# Patient Record
Sex: Female | Born: 1940 | Race: White | Hispanic: No | Smoking: Never smoker
Health system: Southern US, Community
[De-identification: ages and names within clinical notes are randomized; demographics above are authoritative.]

## PROBLEM LIST (undated history)

## (undated) DIAGNOSIS — E079 Disorder of thyroid, unspecified: Secondary | ICD-10-CM

## (undated) DIAGNOSIS — F319 Bipolar disorder, unspecified: Secondary | ICD-10-CM

## (undated) DIAGNOSIS — F419 Anxiety disorder, unspecified: Secondary | ICD-10-CM

## (undated) HISTORY — DX: Anxiety disorder, unspecified: F41.9

---

## 2004-07-28 ENCOUNTER — Other Ambulatory Visit: Payer: Self-pay

## 2004-08-03 ENCOUNTER — Inpatient Hospital Stay: Payer: Self-pay | Admitting: Obstetrics & Gynecology

## 2008-04-30 ENCOUNTER — Emergency Department: Payer: Self-pay | Admitting: Emergency Medicine

## 2008-04-30 ENCOUNTER — Other Ambulatory Visit: Payer: Self-pay

## 2008-06-06 ENCOUNTER — Emergency Department: Payer: Self-pay | Admitting: Emergency Medicine

## 2008-06-06 ENCOUNTER — Other Ambulatory Visit: Payer: Self-pay

## 2008-06-10 ENCOUNTER — Inpatient Hospital Stay: Payer: Self-pay | Admitting: Psychiatry

## 2009-06-09 ENCOUNTER — Emergency Department: Payer: Self-pay | Admitting: Emergency Medicine

## 2009-08-07 ENCOUNTER — Emergency Department: Payer: Self-pay | Admitting: Emergency Medicine

## 2009-08-10 ENCOUNTER — Emergency Department: Payer: Self-pay | Admitting: Emergency Medicine

## 2009-12-01 ENCOUNTER — Emergency Department: Payer: Self-pay | Admitting: Emergency Medicine

## 2009-12-13 ENCOUNTER — Inpatient Hospital Stay: Payer: Self-pay | Admitting: Psychiatry

## 2013-08-17 ENCOUNTER — Ambulatory Visit (INDEPENDENT_AMBULATORY_CARE_PROVIDER_SITE_OTHER): Payer: Medicare HMO | Admitting: Podiatry

## 2013-08-17 ENCOUNTER — Encounter: Payer: Self-pay | Admitting: Podiatry

## 2013-08-17 VITALS — BP 141/92 | HR 66 | Resp 14 | Ht 63.0 in | Wt 165.0 lb

## 2013-08-17 DIAGNOSIS — M79609 Pain in unspecified limb: Secondary | ICD-10-CM

## 2013-08-17 DIAGNOSIS — B351 Tinea unguium: Secondary | ICD-10-CM

## 2013-08-17 NOTE — Progress Notes (Signed)
[name] presents today chief complaint of painful toenails one through 5 bilateral.  Objective: Pulses are palpable bilateral nails are thick yellow dystrophic clinically mycotic and painful.  Assessment: Pain in limb secondary to onychomycosis.  Plan: Debridement of nails 1 through 5 bilateral is cover service secondary to pain. Followup with her in 3 months.

## 2013-11-11 ENCOUNTER — Ambulatory Visit: Payer: Medicare HMO | Admitting: Podiatry

## 2013-12-14 ENCOUNTER — Ambulatory Visit (INDEPENDENT_AMBULATORY_CARE_PROVIDER_SITE_OTHER): Payer: Medicare HMO | Admitting: Podiatry

## 2013-12-14 VITALS — BP 113/75 | HR 86 | Resp 16 | Ht 63.0 in | Wt 170.0 lb

## 2013-12-14 DIAGNOSIS — B351 Tinea unguium: Secondary | ICD-10-CM

## 2013-12-14 DIAGNOSIS — M79609 Pain in unspecified limb: Secondary | ICD-10-CM

## 2013-12-14 NOTE — Progress Notes (Signed)
She presents today with a chief complaint of painful toenails one through 5 bilateral.  Objective: Vital signs are stable she is alert and oriented x3.  Assessment: Pain in limb secondary to onychomycosis 1 through 5 bilateral.  Plan: Debridement of nails 1 through 5 bilateral is cover service.

## 2014-02-08 ENCOUNTER — Ambulatory Visit: Payer: Self-pay | Admitting: Internal Medicine

## 2014-03-15 ENCOUNTER — Ambulatory Visit (INDEPENDENT_AMBULATORY_CARE_PROVIDER_SITE_OTHER): Payer: Medicare HMO | Admitting: Podiatry

## 2014-03-15 VITALS — BP 102/68 | HR 92 | Resp 16

## 2014-03-15 DIAGNOSIS — M79609 Pain in unspecified limb: Secondary | ICD-10-CM

## 2014-03-15 DIAGNOSIS — B351 Tinea unguium: Secondary | ICD-10-CM

## 2014-03-15 NOTE — Progress Notes (Signed)
She presents today with a chief complaint of painful elongated toenails one through 5 bilateral.  Objective: Pulses are palpable bilateral. Nails are thick yellow dystrophic and mycotic and painful palpation.  Assessment: Pain in limb secondary to onychomycosis 1 through 5 bilateral.  Plan: Debridement of nails 1 through 5 bilateral covered service secondary to pain.

## 2014-04-02 DIAGNOSIS — D649 Anemia, unspecified: Secondary | ICD-10-CM | POA: Insufficient documentation

## 2014-05-16 ENCOUNTER — Emergency Department: Payer: Self-pay | Admitting: Emergency Medicine

## 2014-05-16 LAB — URINALYSIS, COMPLETE
BILIRUBIN, UR: NEGATIVE
Blood: NEGATIVE
Glucose,UR: NEGATIVE mg/dL (ref 0–75)
KETONE: NEGATIVE
Nitrite: NEGATIVE
Ph: 5 (ref 4.5–8.0)
RBC,UR: 13 /HPF (ref 0–5)
Specific Gravity: 1.025 (ref 1.003–1.030)
Squamous Epithelial: 35
WBC UR: 45 /HPF (ref 0–5)

## 2014-05-16 LAB — CBC WITH DIFFERENTIAL/PLATELET
BASOS PCT: 0.3 %
Basophil #: 0 10*3/uL (ref 0.0–0.1)
EOS ABS: 0 10*3/uL (ref 0.0–0.7)
Eosinophil %: 0.3 %
HCT: 37.2 % (ref 35.0–47.0)
HGB: 12.2 g/dL (ref 12.0–16.0)
LYMPHS ABS: 1.7 10*3/uL (ref 1.0–3.6)
Lymphocyte %: 24.7 %
MCH: 28.9 pg (ref 26.0–34.0)
MCHC: 32.7 g/dL (ref 32.0–36.0)
MCV: 88 fL (ref 80–100)
Monocyte #: 0.5 x10 3/mm (ref 0.2–0.9)
Monocyte %: 7.7 %
NEUTROS ABS: 4.6 10*3/uL (ref 1.4–6.5)
Neutrophil %: 67 %
Platelet: 277 10*3/uL (ref 150–440)
RBC: 4.21 10*6/uL (ref 3.80–5.20)
RDW: 13.8 % (ref 11.5–14.5)
WBC: 6.9 10*3/uL (ref 3.6–11.0)

## 2014-05-16 LAB — BASIC METABOLIC PANEL
ANION GAP: 8 (ref 7–16)
BUN: 10 mg/dL (ref 7–18)
Calcium, Total: 8.9 mg/dL (ref 8.5–10.1)
Chloride: 101 mmol/L (ref 98–107)
Co2: 25 mmol/L (ref 21–32)
Creatinine: 0.8 mg/dL (ref 0.60–1.30)
EGFR (African American): >60
GLUCOSE: 107 mg/dL — AB (ref 65–99)
Osmolality: 268 (ref 275–301)
POTASSIUM: 3.9 mmol/L (ref 3.5–5.1)
SODIUM: 134 mmol/L — AB (ref 136–145)

## 2014-05-18 LAB — URINE CULTURE

## 2014-06-14 ENCOUNTER — Ambulatory Visit: Payer: Medicare HMO | Admitting: Podiatry

## 2014-07-19 ENCOUNTER — Ambulatory Visit: Payer: Medicare HMO | Admitting: Podiatry

## 2014-08-18 ENCOUNTER — Ambulatory Visit: Payer: Medicare HMO | Admitting: Podiatry

## 2014-09-27 ENCOUNTER — Other Ambulatory Visit: Payer: Medicare HMO

## 2014-09-27 ENCOUNTER — Ambulatory Visit: Payer: Medicare HMO | Admitting: Podiatry

## 2014-12-30 ENCOUNTER — Inpatient Hospital Stay: Payer: Self-pay | Admitting: Internal Medicine

## 2015-01-03 ENCOUNTER — Encounter
Admit: 2015-01-03 | Disposition: A | Discharge status: Home / Self Care | Payer: Self-pay | Attending: Internal Medicine | Admitting: Internal Medicine

## 2015-01-20 LAB — COMPREHENSIVE METABOLIC PANEL
ANION GAP: 10 (ref 7–16)
Albumin: 3.6 g/dL
Alkaline Phosphatase: 140 U/L — ABNORMAL HIGH
BUN: 17 mg/dL
Bilirubin,Total: 0.2 mg/dL — ABNORMAL LOW
CO2: 28 mmol/L
Calcium, Total: 9.5 mg/dL
Chloride: 93 mmol/L — ABNORMAL LOW
Creatinine: 0.72 mg/dL
EGFR (African American): >60
Glucose: 89 mg/dL
Potassium: 4 mmol/L
SGOT(AST): 22 U/L
SGPT (ALT): 16 U/L
SODIUM: 131 mmol/L — AB
Total Protein: 7.1 g/dL

## 2015-01-20 LAB — CBC WITH DIFFERENTIAL/PLATELET
BASOS PCT: 0.5 %
Basophil #: 0 10*3/uL (ref 0.0–0.1)
EOS PCT: 0 %
Eosinophil #: 0 10*3/uL (ref 0.0–0.7)
HCT: 34.6 % — ABNORMAL LOW (ref 35.0–47.0)
HGB: 11.3 g/dL — ABNORMAL LOW (ref 12.0–16.0)
Lymphocyte #: 1.5 10*3/uL (ref 1.0–3.6)
Lymphocyte %: 20.3 %
MCH: 29.3 pg (ref 26.0–34.0)
MCHC: 32.7 g/dL (ref 32.0–36.0)
MCV: 90 fL (ref 80–100)
MONO ABS: 0.4 x10 3/mm (ref 0.2–0.9)
Monocyte %: 5.5 %
NEUTROS PCT: 73.7 %
Neutrophil #: 5.5 10*3/uL (ref 1.4–6.5)
Platelet: 348 10*3/uL (ref 150–440)
RBC: 3.85 10*6/uL (ref 3.80–5.20)
RDW: 13.8 % (ref 11.5–14.5)
WBC: 7.4 10*3/uL (ref 3.6–11.0)

## 2015-01-20 LAB — MAGNESIUM: MAGNESIUM: 2.1 mg/dL

## 2015-01-20 LAB — TSH: Thyroid Stimulating Horm: 15.59 u[IU]/mL — ABNORMAL HIGH

## 2015-01-26 ENCOUNTER — Emergency Department
Admit: 2015-01-26 | Disposition: A | Discharge status: Other Acute Inpatient Hospital | Payer: Self-pay | Admitting: Internal Medicine

## 2015-01-26 DIAGNOSIS — M3390 Dermatopolymyositis, unspecified, organ involvement unspecified: Secondary | ICD-10-CM | POA: Insufficient documentation

## 2015-01-26 DIAGNOSIS — K219 Gastro-esophageal reflux disease without esophagitis: Secondary | ICD-10-CM | POA: Insufficient documentation

## 2015-01-26 DIAGNOSIS — F319 Bipolar disorder, unspecified: Secondary | ICD-10-CM | POA: Insufficient documentation

## 2015-01-26 LAB — CBC
HCT: 34.5 % — ABNORMAL LOW (ref 35.0–47.0)
HGB: 11.2 g/dL — ABNORMAL LOW (ref 12.0–16.0)
MCH: 28.9 pg (ref 26.0–34.0)
MCHC: 32.5 g/dL (ref 32.0–36.0)
MCV: 89 fL (ref 80–100)
Platelet: 279 10*3/uL (ref 150–440)
RBC: 3.88 10*6/uL (ref 3.80–5.20)
RDW: 13.5 % (ref 11.5–14.5)
WBC: 13.3 10*3/uL — ABNORMAL HIGH (ref 3.6–11.0)

## 2015-01-26 LAB — COMPREHENSIVE METABOLIC PANEL
ALT: 15 U/L
AST: 23 U/L
Albumin: 3.7 g/dL
Alkaline Phosphatase: 141 U/L — ABNORMAL HIGH
Anion Gap: 8 (ref 7–16)
BUN: 12 mg/dL
Bilirubin,Total: 0.3 mg/dL
CO2: 27 mmol/L
CREATININE: 0.76 mg/dL
Calcium, Total: 9.2 mg/dL
Chloride: 93 mmol/L — ABNORMAL LOW
EGFR (African American): >60
GLUCOSE: 128 mg/dL — AB
POTASSIUM: 4.2 mmol/L
SODIUM: 128 mmol/L — AB
TOTAL PROTEIN: 7.1 g/dL

## 2015-01-28 ENCOUNTER — Encounter
Admit: 2015-01-28 | Disposition: A | Discharge status: Home / Self Care | Payer: Self-pay | Attending: Internal Medicine | Admitting: Internal Medicine

## 2015-02-21 LAB — SURGICAL PATHOLOGY

## 2015-02-27 NOTE — Discharge Summary (Signed)
PATIENT NAME:  [name], HINE MR#:  [id number] DATE OF BIRTH:  Jul 13, 1941  DATE OF ADMISSION:  12/30/2014 DATE OF DISCHARGE:  01/03/2015   TYPE OF DISCHARGE: The patient is transferred to a skilled nursing facility.   REASON FOR ADMISSION: Right hip fracture.   HISTORY OF PRESENT ILLNESS: The patient is a 74 year old female with a history of dermatomyositis, hypothyroidism, bipolar disorder and hypertension who presented to the Emergency Room with right lower extremity pain after a fall 2 weeks prior. She was found to have a hip fracture and was admitted for further evaluation.   PAST MEDICAL HISTORY:  1.  Dermatomyositis.  2.  Bipolar disorder.  3.  Benign hypertension.  4.  Hypothyroidism.   MEDICATIONS ON ADMISSION: Please see admission note.   ALLERGIES: No known drug allergies.   SOCIAL HISTORY: Negative for alcohol or tobacco abuse.   FAMILY HISTORY: Positive for coronary artery disease.   REVIEW OF SYSTEMS:  As per admission note.   PHYSICAL EXAMINATION:  GENERAL: The patient was in no acute distress.  VITAL SIGNS: Stable and she was afebrile.  HEENT: Unremarkable.  NECK: Supple without JVD.  LUNGS: Clear.  CARDIAC: Regular rate and rhythm. Normal S1, S2.  ABDOMEN: Soft and nontender.  EXTREMITIES: Without edema.  NEUROLOGIC: Grossly nonfocal.   HOSPITAL COURSE: The patient was admitted with a right hip fracture. She was seen in consultation by orthopedics and underwent surgical repair. She was subsequently seen by physical therapy and ambulated. Short-term rehab was recommended and the family agreed. There were no medical complications during this hospitalization. She was maintained on her routine regimen. By 01/03/2015, the patient was stable and ready for discharge to a skilled nursing facility.   DISCHARGE DIAGNOSES:  1.  Right hip fracture, status post surgical repair.  2.  Benign hypertension.  3.  Hypothyroidism.  4.  Postoperative anemia.  5.  Bipolar  disorder.  6.  Dermatomyositis.   DISCHARGE MEDICATIONS:  1.  Os-Cal-D 1 p.o. b.i.d.  2.  Surfak 240 mg p.o. at bedtime.  3.  Iron sulfate 325 mg p.o. b.i.d.  4.  Oxycodone 10 mg p.o. every 4 hours p.r.n. pain.  5.  BuSpar 15 mg p.o. b.i.d.  6.  Synthroid 75 mcg p.o. daily.  7.  Losartan 25 mg p.o. daily.  8.  Effexor-XR 150 mg p.o. daily.  9.  Zantac 150 mg p.o. b.i.d.  10.  Abilify 2 mg p.o. daily.   FOLLOW-UP PLANS AND APPOINTMENTS: The patient will be followed by the resident physician at the skilled nursing facility. She is on a regular diet. She will be seen in consultation by physical therapy. TED hose to be worn daily, taken off at bedtime. CBC and a MET-B in 1 week.     ____________________________ Leonie Douglas. Doy Hutching, MD jds:DT D: 01/03/2015 07:14:52 ET T: 01/03/2015 08:11:08 ET JOB#: [id number]  cc: Leonie Douglas. Doy Hutching, MD, <Dictator> Morgann Woodburn Lennice Sites MD ELECTRONICALLY SIGNED 01/04/2015 20:50

## 2015-02-27 NOTE — Consult Note (Signed)
PATIENT NAME:  [name], [name] MR#:  [id number] DATE OF BIRTH:  1941-01-09  DATE OF CONSULTATION:  12/30/2014  REFERRING PHYSICIAN:   CONSULTING PHYSICIAN:  Timoteo Gaul, MD  REASON FOR CONSULTATION: Right femoral neck hip fracture.   HISTORY OF PRESENT ILLNESS: [name] is a 74 year old female who has had approximately 2 weeks of right lower extremity pain without known trauma. She had been prescribed physical therapy recently by her primary care physician. She had worsening pain over the past 2 weeks until yesterday when she had difficulty bearing weight on the right lower extremity. She was brought to the [facility] Emergency Department where she was diagnosed with a displaced right femoral neck hip fracture.   PAST MEDICAL HISTORY: Includes hypertension, hypothyroidism, bipolar disorder, and history of dermatomyositis.   PAST SURGICAL HISTORY: Includes tonsillectomy, hemorrhoidectomy.   ALLERGIES: No known drug allergies.   HOME MEDICATIONS: Include Abilify 2 mg once daily at bedtime, biotin 1000 mcg tablet 0.5 mg orally once daily, buspirone 15 mg 1 tablet b.i.d., Colace 100 mg b.i.d., levothyroxine 75 mcg 1 tablet q.a.m., lisinopril 5 mg daily, losartan 100 mg daily, meloxicam 7.5 mg 1 tablet daily, multivitamin daily, polyethylene glycol oral powder twice a day, tramadol 15 mg 1 tablet q. 6 hours p.r.n. for pain, venlafaxine 150 mg 1 tablet daily.   PHYSICAL EXAMINATION: The patient is seen in her hospital room. She is lying supine in bed. There is Buck's traction on the right lower extremity. She has slight shortening and external rotation of her right lower extremity. Her skin is intact. Her thigh and leg compartments are soft and compressible. I marked the right hip with the word "yes" and my initials according to the hospital's correct site of surgery protocol. The patient had palpable pedal pulses. She has intact sensation throughout the right lower extremity. She can flex  and extend her toes.   RADIOLOGY: X-ray films of the pelvis and right hip were reviewed today. These films demonstrate a displaced femoral neck hip fracture. There were no associated pelvic fractures and there is no fracture or dislocation to the left hip.   ASSESSMENT: Right femoral neck hip fracture.   PLAN: I am recommending a right hemiarthroplasty for this patient. She is ambulatory at baseline. I reviewed the risks and benefits of surgery including infection requiring removal of the prosthesis, bleeding requiring blood transfusion, nerve or blood vessel injury including injury to the sciatic nerve leading to footdrop and may require long-term use of an AFO, persistent right hip pain, fracture, dislocation and the need for development of arthritis and the need for further surgery including conversion to a total hip arthroplasty. Medical risks include DVT and pulmonary embolism, myocardial infarction, stroke, pneumonia, respiratory failure, and death. I spoke with Dr. Fulton Reek this morning. He had seen the patient and feels that she is medically stable for surgery. I have reviewed her labs and radiographic studies in preparation for this case. The surgical site was marked send sign, consent is signed. She is n.p.o. Surgery is scheduled this morning. I answered all the patient's questions. I attempted to contact her son by phone, but was unable to make contact as of the time of this dictation.    ____________________________ Timoteo Gaul, MD klk:at D: 12/30/2014 08:54:30 ET T: 12/30/2014 09:34:14 ET JOB#: [id number]  cc: Timoteo Gaul, MD, <Dictator> Timoteo Gaul MD ELECTRONICALLY SIGNED 12/31/2014 18:31

## 2015-02-27 NOTE — H&P (Signed)
PATIENT NAME:  [name], [name] MR#:  [id number] DATE OF BIRTH:  1941/07/21  DATE OF ADMISSION:  12/30/2014  REFERRING DOCTOR: Ahmed Prima, MD   PRIMARY CARE PRACTITIONER: Leonie Douglas. Doy Hutching, MD, of [facility].   ADMITTING DOCTOR: Juluis Mire, MD    CHIEF COMPLAINT: Ongoing right lower extremity pain for the past 2 weeks.    HISTORY OF PRESENT ILLNESS: A 74 year old Caucasian female with a past medical history of hypertension, hypothyroidism, bipolar disorder, dermatomyositis, a resident of nursing home was brought by EMS with the complaints of worsening pain of right lower extremity, which started around 2 weeks ago. The patient stated that she developed right lower extremity pain about 2 weeks ago for which she was advised to undergo physical therapy by her primary care practitioner, and she has been undergoing physical therapy. The pain gradually worsened today and it was unbearable today, hence came to the Emergency Room for further evaluation. In the Emergency Room, the patient was evaluated by the ED physician and workup revealed fracture of the right femur of the neck, which is mildly displaced. The patient was also noted to have elevated blood pressure on arrival to the Emergency Room secondary due to pain for which the patient received IV pain control medications, following which her blood pressure is currently under reasonable control. Her pain is also under reasonable control with pain medication at this time and denies any complaints. The patient denies any history of fall. No history of any local injury to the right lower extremity. Denies any history of fever. No chest pain. No shortness of breath. No dizziness. No cough. No fever. No dysuria, frequency, urgency. No nausea, vomiting, diarrhea, constipation.   PAST MEDICAL HISTORY:  1.  Hypertension.  2.  Hypothyroidism.  3.  Bipolar disorder.  4.  History of dermatomyositis.   PAST SURGICAL HISTORY:  1.   Tonsillectomy.  2.  Hemorrhoidectomy.   ALLERGIES: No known drug allergies.   FAMILY HISTORY: Father died of heart disease.   SOCIAL HISTORY: She is a resident of nursing home and is single. Denies any history of smoking, alcohol, or substance abuse.    HOME MEDICATIONS:  1.  Abilify 2 mg tablet 1 tablet orally once a day at bedtime.  2.  Biotin 1000 mcg tablet 0.5 mg orally once a day.  3.  Buspirone 15 mg oral tablet 1 tablet 2 times a day.  4.  Colace 100 mg 1 tablet orally 2 times a day.  5.  Levothyroxine 75 mcg oral tablet 1 tablet in the morning.  6.  Lisinopril 5 mg 1 tablet orally once a day.  7.  Losartan 100 mg 1 tablet orally once a day.  8.  Meloxicam 7.5 mg oral tablet 1 tablet orally once a day.  9.  Multivitamin 1 capsule orally once a day.  10.  Polyethylene glycol oral powder twice a day.  11.  Tramadol 50 mg 1 tablet orally every 6 hours.  12.  Venlafaxine 150 mg oral capsule extended release 1 capsule once a day.    REVIEW OF SYSTEMS:  CONSTITUTIONAL: Negative for fever or chills. No fatigue. No generalized weakness.  EYES: Negative for blurred vision, double vision. No pain. No redness. No discharge.  EARS, NOSE, AND THROAT: Negative for tinnitus, ear pain, hearing loss, epistaxis, nasal discharge.  RESPIRATORY: Negative for cough, wheezing, dyspnea, hemoptysis, or painful respiration.  CARDIOVASCULAR: Negative for palpitations, chest pain, dizziness, syncopal episode, orthopnea, dyspnea on exertion, or pedal  edema.  GASTROINTESTINAL: Negative for nausea, vomiting, diarrhea, constipation, abdominal pain, hematemesis, melena, or GERD symptoms.  GENITOURINARY: Negative for dysuria, frequency, urgency, or hematuria.  ENDOCRINE Negative for polyuria or nocturia. No heat or cold intolerance.  HEMATOLOGIC AND LYMPHATIC : Negative for anemia, easy bruising or bleeding, or swollen glands.  INTEGUMENTARY: Negative for acne, skin rash, or lesions.  MUSCULOSKELETAL: Right  lower extremity pain ongoing for the past 2 weeks, as noted in the history of present illness.   NEUROLOGICAL: Negative for focal weakness or numbness. No history of CVA, TIA, or seizure disorder.  PSYCHIATRIC: Positive for history of bipolar disorder, stable on home medications.   PHYSICAL EXAMINATION:  VITAL SIGNS: Temperature 98.4 degrees Fahrenheit; pulse rate 72 per minute; respirations 18 per minute; oxygen saturation 93% on room air; blood pressure on arrival 214/82, current blood pressure is 131/63.   GENERAL: Well-developed, well-nourished, alert, in no acute distress at this time, comfortably resting in the bed.  HEAD: Atraumatic, normocephalic.  EYES: Pupils equal, react to light and accommodation. No conjunctival pallor. No icterus. Extraocular movements intact.  NOSE: No drainage. No lesions.  EARS: No drainage. No external lesions.  ORAL CAVITY: No mucosal lesions. No exudates.  NECK: Supple. No JVD. No thyromegaly. No carotid bruit. Range of motion of neck within normal limits.  RESPIRATORY: Good respiratory effort. Not using accessory muscles of respiration. Bilateral vesicular breath sounds present. No rales or rhonchi.  CARDIOVASCULAR: S1, S2 regular. No murmurs, gallops, or clicks. Pulses equal at carotid, femoral, and pedal pulses. No peripheral edema.  GASTROINTESTINAL: Abdomen is soft, nontender. No hepatosplenomegaly. No masses. No rigidity. No guarding. Bowel sounds present and equal in all 4 quadrants.  GENITOURINARY: Deferred.  MUSCULOSKELETAL: Shortening of right lower extremity with external rotation present. Range of motion extremely painful. Strength and tone normal.  SKIN: Inspection within normal limits.  LYMPHATIC: No cervical lymphadenopathy.  VASCULAR: Good dorsalis pedis and posterior tibial pulses.  NEUROLOGIC: Alert, awake, and oriented x 3. Cranial nerves II-XII grossly intact. No sensory deficit. Motor strength 5/5 in both upper and lower extremities.  Plantars downgoing.  PSYCHIATRIC: Alert, awake, and oriented x 3. Judgment and insight adequate. Memory and mood within normal limits.   ANCILLARY DATA:  LABORATORY DATA: Serum glucose 117, BUN 18, creatinine 0.86, sodium 134, potassium 4.3, chloride 101, bicarbonate 27, total calcium 9.1. Total protein 7.3, albumin 3.3, total bilirubin 0.3, alkaline phosphatase 132, AST 26, ALT 13. Total CK 116. Troponin less than 0.02. TSH 3.44. WBC 13.7, hemoglobin 12.0, hematocrit 37.1, platelet count 276,000. Prothrombin time 13.2, INR 1.0, activated PTT 33.2. Urinalysis: 1 WBC, no bacteria.    IMAGING STUDIES:  1.  Right hip x-ray: Basicervical fracture through the right femur neck with mild displacement present.  2.  Chest x-ray: Moderate to large hiatus hernia. Mild cardiomegaly. No acute cardiopulmonary process identified.    EKG: Normal sinus rhythm with ventricular rate of 70 beats per minute. Incomplete right bundle branch block. No acute ST-T changes.    ASSESSMENT AND PLAN: A 74 year old Caucasian female, a nursing home resident, with a past medical history of hypertension, hypothyroidism, bipolar disorder, presents with a 2-week history of right lower extremity pain, noted to have fracture of right femoral neck with mild displacement. No history of any reported fall or local injury. 1.  Fracture, right femoral neck with mild displacement. No history of fall or local injury. PLAN: Admit to medical floor, rest, pain control medications. Orthopedic consultation and physical therapy consultation requested for further  management.  2.  Hypertension. The patient on home medications. Initial blood pressure high on arrival secondary to pain. Blood pressure is better controlled with pain control. Continue home medications.  3.  Hypothyroidism, stable on levothyroxine. Continue current dosage.  4.  Bipolar disorder, stable on home medications. Continue same.  5.  Deep vein thrombosis prophylaxis. Subcutaneous  heparin.  6.  Gastrointestinal prophylaxis. Proton pump inhibitor.   CODE STATUS: Full code.   TIME SPENT: 50 minutes.    ____________________________ Juluis Mire, MD enr:bm D: 12/30/2014 02:52:21 ET T: 12/30/2014 05:12:29 ET JOB#: [id number]  cc: Juluis Mire, MD, <Dictator> Leonie Douglas. Doy Hutching, MD Juluis Mire MD ELECTRONICALLY SIGNED 12/30/2014 20:40

## 2015-02-27 NOTE — Op Note (Signed)
PATIENT NAME:  [name], [name] MR#:  [id number] DATE OF BIRTH:  June 01, 1941  DATE OF PROCEDURE:  12/30/2014  PREOPERATIVE DIAGNOSIS: Right displaced femoral neck hip fracture.   POSTOPERATIVE DIAGNOSIS: Right displaced femoral neck hip fracture.   PROCEDURE: Right hip hemiarthroplasty.   ANESTHESIA: General.   SPECIMEN: Femoral head to pathology.   SURGEON: [place], M.D.   ESTIMATED BLOOD LOSS:  100 mL.   COMPLICATIONS: None.   IMPLANTS: Stryker Accolade HFX size 2 stem, a 43 mm UHR universal head bipolar component with a 26 mm +0 L-FIT V40 femoral head.   INDICATION FOR THE PROCEDURE: The patient is a 74 year old female who has had right lower extremity pain for approximately two weeks. The patient denies a fall. She was sent to the [facility] Emergency Department where x-rays revealed a displaced femoral neck hip fracture. Orthopedics was consulted for management of this hip fracture. I explained to the patient and her son about the injury. I am recommending a right hip hemiarthroplasty. She ambulates at baseline.  I reviewed the risks and benefits of surgery with the patient and her son, which include infection requiring removal of the prosthesis, bleeding requiring blood transfusion, nerve or blood vessel injury, especially injury to the sciatic nerve which may lead to lower extremity numbness or footdrop and she may require long-term use of an AFO, fracture, dislocation, leg length discrepancy, change in lower extremity rotation, persistent right hip pain, and the need for further surgery including conversion to a total hip arthroplasty. Medical risks include, but are not limited to DVT and pulmonary embolism, myocardial infarction, stroke, pneumonia, respiratory failure, and death. The patient was cleared by the medical service. I had reviewed the labs and radiographic studies in preparation for this case. The patient and her son understood the risks and benefits of surgery  and wished to proceed with surgery.   PROCEDURE NOTE: The patient had her right hip marked according to the hospital's correct site of surgery protocol. I put my initials and the word "yes" over the right hip. The patient was brought to the Operating Room. She underwent general anesthesia. She was then positioned in a left lateral decubitus position with the right hip up. She was positioned using a pegboard. An axillary roll was placed under her left side, and adequate padding was placed under her left leg to protect the common peroneal nerve during the case. She was then prepped and draped in a sterile fashion. A timeout was performed to verify the patient's name, date of birth, medical record number, correct site of surgery and correct procedure to be performed. It was also used to verify the patient had received antibiotics and all appropriate instruments, implants, and radiographic studies were available in the room. Once all in attendance were in agreement, the case began.   A curvilinear incision was made over the greater trochanter. Subcutaneous tissues were dissected using electrocautery. All bleeding vessels were cauterized during exposure. The fascia lata was then identified and sharply incised with a deep #10 blade. The gluteus maximus muscle was split in line with its fibers revealing the underlying hip bursa and external rotators. The hip bursa was excised and the external rotators were removed from their attachment to the posterior greater trochanter, tagged for later repair and reflected posteriorly to protect the sciatic nerve. A T-shaped capsulotomy was then performed. The capsule was tagged for later repair. The fracture was then identified. The femoral head was removed using a corkscrew device.  The femoral head  was measured at 43 mm in diameter. A 43 mm trial was placed into the acetabulum and had excellent fit. The attention was then turned to femur preparation after the pulvinar in the  acetabulum had been removed and all bleeding was cauterized. The acetabulum was copiously irrigated and suctioned dry.   The hip skid was placed under the femoral neck. The femur was internally rotated. A Kober retractor was also used to allow for visualization of the calcar. The fracture exited just above the lesser trochanter. A single hand reamer was used to enter into the femoral canal. A femoral canal sounder was then placed into the femoral canal to ensure no penetration of the femoral cortex that occurred during hand reaming. Sequential broaches were then used starting with a size zero. The best medial and lateral fit was achieved with a size 2 femoral trial.  A 127 degree neck trial was then placed on the femoral stem and a 43 + 0 head was placed on the trunnion. This hip was reduced and found to have excellent stability.  Leg lengths were equivalent.  An intraoperative AP pelvis was taken to ensure adequate canal fill of the femoral prosthesis and adequate leg lengths since this fracture was possibly two weeks old. The x-ray revealed a good canal fill with a size two femoral trial as well as equivalent leg lengths. All trial components were then removed. The hip joint was copiously irrigated with pulse lavage. The actual Stryker Accolade HFX 127 degree neck angle size 2 hip stem was inserted with gentle malleting. Once it had a snug fit the 43 + 0 trial was placed and the hip was reduced and, again, taken through a full range of motion and had excellent stability and equivalent leg lengths. The trial head was removed and the actual Stryker UHR universal bipolar 43 mm outer diameter component with a 26 mm L-FIT V40 femoral head with a + 0 offset was placed inside. This bipolar component was then reduced into the hip joint. Again, the hip had excellent stability and full range of motion and equivalent leg lengths. The hip joint was then copiously irrigated. The posterior capsule was repaired using a #2  Tycron. The piriformis was repaired using a soft tissue repair into the abductor tendon. The hip joint was, again, copiously irrigated with pulse lavage. The fascia lata was closed with interrupted 0 Vicryl; subcutaneous tissue closed in two layers with zero and 2-0 Vicryl and the skin approximated with staples. A dry sterile dressing was applied and the patient was awakened and brought to the PACU in stable condition. I was scrubbed and present for the entire case, and all sharp and instrument counts were correct at the conclusion of the case. I met with the patient's son in the postoperative consultation room to let him know the patient had done well with surgery. The surgery had gone without complication and the patient was stable in the recovery room.     ____________________________ Timoteo Gaul, MD klk:at D: 12/31/2014 17:42:40 ET T: 12/31/2014 19:10:27 ET JOB#: [id number]  cc: Timoteo Gaul, MD, <Dictator> Timoteo Gaul MD ELECTRONICALLY SIGNED 01/10/2015 12:24

## 2015-04-05 ENCOUNTER — Emergency Department
Admission: EM | Admit: 2015-04-05 | Discharge: 2015-04-05 | Disposition: A | Discharge status: Home / Self Care | Payer: Medicare PPO | Attending: Emergency Medicine | Admitting: Emergency Medicine

## 2015-04-05 ENCOUNTER — Emergency Department: Payer: Medicare PPO

## 2015-04-05 ENCOUNTER — Encounter: Payer: Self-pay | Admitting: Emergency Medicine

## 2015-04-05 ENCOUNTER — Other Ambulatory Visit: Payer: Self-pay

## 2015-04-05 DIAGNOSIS — F319 Bipolar disorder, unspecified: Secondary | ICD-10-CM | POA: Insufficient documentation

## 2015-04-05 DIAGNOSIS — K5641 Fecal impaction: Secondary | ICD-10-CM | POA: Insufficient documentation

## 2015-04-05 DIAGNOSIS — K5900 Constipation, unspecified: Secondary | ICD-10-CM

## 2015-04-05 DIAGNOSIS — Z79899 Other long term (current) drug therapy: Secondary | ICD-10-CM | POA: Insufficient documentation

## 2015-04-05 MED ORDER — POLYETHYLENE GLYCOL 3350 17 G PO PACK
17.0000 g | PACK | Freq: Every day | ORAL | Status: DC
  Administered 2015-04-05: 17 g via ORAL
  Filled 2015-04-05 (×2): qty 1

## 2015-04-05 MED ORDER — FLEET ENEMA 7-19 GM/118ML RE ENEM
1.0000 | ENEMA | Freq: Once | RECTAL | Status: AC
  Administered 2015-04-05: 1 via RECTAL

## 2015-04-05 NOTE — ED Notes (Signed)
Brought to ed via ems from NH for evaluation of fecal impaction. Received A&O*3, speaking full sentences, complaining of abdominal pain 3/10. Pt states I have'nt had a BM since last week Tuesday.

## 2015-04-05 NOTE — Discharge Instructions (Signed)
Fecal Impaction °A fecal impaction happens when there is a large, firm amount of stool (or feces) that cannot be passed. The impacted stool is usually in the rectum, which is the lowest part of the large bowel. The impacted stool can block the colon and cause significant problems. °CAUSES  °The longer stool stays in the rectum, the harder it gets. Anything that slows down your bowel movements can lead to fecal impaction, such as: °· Constipation. This can be a long-standing (chronic) problem or can happen suddenly (acute). °· Painful conditions of the rectum, such as hemorrhoids or anal fissures. The pain of these conditions can make you try to avoid having bowel movements. °· Narcotic pain-relieving medicines, such as methadone, morphine, or codeine. °· Not drinking enough fluids. °· Inactivity and bed rest over long periods of time. °· Diseases of the brain or nervous system that damage the nerves controlling the muscles of the intestines. °SIGNS AND SYMPTOMS  °· Lack of normal bowel movements or changes in bowel patterns. °· Sense of fullness in the rectum but unable to pass stool. °· Pain or cramps in the abdominal area (often after meals). °· Thin, watery discharge from the rectum. °DIAGNOSIS  °Your health care provider may suspect that you have a fecal impaction based on your symptoms and a physical exam. This will include an exam of your rectum. Sometimes X-rays or lab testing may be needed to confirm the diagnosis and to be sure there are no other problems.  °TREATMENT  °· Initially an impaction can be removed manually. Using a gloved finger, your health care provider can remove hard stool from your rectum. °· Medicine is sometimes needed. A suppository or enema can be given in the rectum to soften the stool, which can stimulate a bowel movement. Medicines can also be given by mouth (orally). °· Though rare, surgery may be needed if the colon has torn (perforated) due to blockage. °HOME CARE INSTRUCTIONS   °· Develop regular bowel habits. This could include getting in the habit of having a bowel movement after your morning cup of coffee or after eating. Be sure to allow yourself enough time on the toilet. °· Maintain a high-fiber diet. °· Drink enough fluids to keep your urine clear or pale yellow as directed by your health care provider. °· Exercise regularly. °· If you begin to get constipated, increase the amount of fiber in your diet. Eat plenty of fruits, vegetables, whole wheat breads, bran, oatmeal, and similar products. °· Take natural fiber laxatives or other laxatives only as directed by your health care provider. °SEEK MEDICAL CARE IF:  °· You have ongoing rectal pain. °· You require enemas or suppositories more than twice a week. °· You have rectal bleeding. °· You have continued problems, or you develop abdominal pain. °· You have thin, pencil-like stools. °SEEK IMMEDIATE MEDICAL CARE IF:  °You have black or tarry stools. °MAKE SURE YOU:  °· Understand these instructions. °· Will watch your condition. °· Will get help right away if you are not doing well or get worse. °Document Released: 07/07/2004 Document Revised: 08/05/2013 Document Reviewed: 04/21/2013 °[employer]® Patient Information ©2015 [employer]. This information is not intended to replace advice given to you by your health care provider. Make sure you discuss any questions you have with your health care provider. ° °

## 2015-04-05 NOTE — ED Provider Notes (Signed)
[facility] Emergency Department Provider Note  ____________________________________________  Time seen: Approximately 9:02 PM  I have reviewed the triage vital signs and the nursing notes.   HISTORY  Chief Complaint Fecal Impaction    HPI [name] is a 74 y.o. female with a history of bipolar disorder who presents with inability to stool for about one week. Patient states she began feeling constipated a week ago, since then she is been unable to have a bowel movement. She has pain around her rectum, and states that it feels like the stool is "stuck" around her bottom. She does take MiraLAX, does have a history of constipation. She denies any pain in her abdomen, vomiting, chest pain, fevers or other concerns.  Current states that she has moderate feeling of the need to move her bowels and she is tried to loosen the stool herself, but has been unable because this too hard.    Past medical history includes bipolar disorder Dermatomyositis  Patient does not smoke, does not drink No past medical history on file.  There are no active problems to display for this patient.   No past surgical history on file.  Current Outpatient Rx  Name  Route  Sig  Dispense  Refill  . ARIPiprazole (ABILIFY) 2 MG tablet   Oral   Take 2 mg by mouth daily.         Marland Kitchen BIOTIN PO   Oral   Take 1,000 mcg by mouth. Take a 1/2 tab by mouth daily         . busPIRone (BUSPAR) 15 MG tablet   Oral   Take 15 mg by mouth daily.         Marland Kitchen docusate sodium (COLACE) 100 MG capsule   Oral   Take 100 mg by mouth daily.         Marland Kitchen levothyroxine (SYNTHROID, LEVOTHROID) 75 MCG tablet   Oral   Take 75 mcg by mouth daily before breakfast.         . lisinopril (PRINIVIL,ZESTRIL) 5 MG tablet   Oral   Take 5 mg by mouth daily.         Marland Kitchen losartan (COZAAR) 100 MG tablet   Oral   Take 100 mg by mouth daily.         . Multiple Vitamins-Minerals (MULTIVITAMIN WITH  MINERALS) tablet   Oral   Take 1 tablet by mouth daily.         Marland Kitchen venlafaxine (EFFEXOR) 75 MG tablet   Oral   Take 75 mg by mouth daily.           Allergies Review of patient's allergies indicates no known allergies.  No family history on file.  Social History History  Substance Use Topics  . Smoking status: Never Smoker   . Smokeless tobacco: Never Used  . Alcohol Use: No    Review of Systems Constitutional: No fever/chills Eyes: No visual changes. ENT: No sore throat. Cardiovascular: Denies chest pain. Respiratory: Denies shortness of breath. Gastrointestinal: No abdominal pain except feeling very constipated.  No nausea, no vomiting.  No diarrhea. She states she is passing gas several times a day, but is unable to pass any more liquid stool. Genitourinary: Negative for dysuria. Musculoskeletal: Negative for back pain. Skin: Negative for rash. Neurological: Negative for headaches, focal weakness or numbness.  10-point ROS otherwise negative.  ____________________________________________   PHYSICAL EXAM:  VITAL SIGNS: ED Triage Vitals  Enc Vitals Group  BP --      Pulse --      Resp --      Temp --      Temp src --      SpO2 --      Weight --      Height --      Head Cir --      Peak Flow --      Pain Score --      Pain Loc --      Pain Edu? --      Excl. in [place]? --     Constitutional: Alert and oriented. Well appearing and in no acute distress. Eyes: Conjunctivae are normal. PERRL. EOMI. Head: Atraumatic. Nose: No congestion/rhinnorhea. Mouth/Throat: Mucous membranes are moist.  Oropharynx non-erythematous. Neck: No stridor.   Cardiovascular: Normal rate, regular rhythm. Grossly normal heart sounds.  Good peripheral circulation. Respiratory: Normal respiratory effort.  No retractions. Lungs CTAB. Gastrointestinal: Soft and nontender. No distention. No abdominal bruits. No CVA tenderness.  Rectal exam performed with nurse present. The  patient has an obvious large impaction. Dark stool. It is heme-negative stool. Nurse and I performed disimpaction at the bedside, we're able to learn move a large volume of stool. Thereafter, the patient was able to begin to slowly defecate on her own. No complications from the disimpaction. Musculoskeletal: No lower extremity tenderness nor edema.  No joint effusions. Neurologic:  Normal speech and language. No gross focal neurologic deficits are appreciated.  Skin:  Skin is warm, dry and intact. No rash noted except an intertriginous spaces which appears consistent with a fungal rash of the groin. Perineum is normal except for some mild erythema around the rectum Psychiatric: Mood and affect are normal. Speech and behavior are normal.  ____________________________________________   LABS (all labs ordered are listed, but only abnormal results are displayed)  Labs Reviewed  CBC  COMPREHENSIVE METABOLIC PANEL   ____________________________________________  EKG   Date: 04/05/2015  Rate: 85  Rhythm: normal sinus rhythm  QRS Axis: normal  Intervals: Right bundle-branch block  ST/T Wave abnormalities: normal  Conduction Disutrbances: none right bundle-branch block  Narrative Interpretation: unremarkable     ____________________________________________  RADIOLOGY  Large stool burden within the colon, particularly rectosigmoid colon. Findings concerning for fecal impaction. No obstruction. No free air. No organomegaly or suspicious calcification. Prior right hip replacement.  IMPRESSION: Findings suggestive of fecal impaction. ____________________________________________   PROCEDURES  Procedure(s) performed: None  Critical Care performed: No  ____________________________________________   INITIAL IMPRESSION / ASSESSMENT AND PLAN / ED COURSE  Pertinent labs & imaging results that were available during my care of the patient were reviewed by me and considered in my  medical decision making (see chart for details).  Patient presents with one week of constipation and need to defecate but inability. She states she feels "impacted". Examination and history support stool impaction. We were able to disimpact her for a large amount of stool at the bedside. At this point, her symptoms seem consistent with her chief complaint. She has no other symptoms of bowel obstruction such as vomiting, severe abdominal pain, or evidence of significant pain on exam. Overall her abdominal exam is benign.  Disimpacted in the ER, we will now give an enema, MiraLAX, and obtain x-ray to evaluate for any evidence of obstruction. ____________________________________________  ----------------------------------------- 10:43 PM on 04/05/2015 -----------------------------------------  After 2 enemas and a repeat disimpaction, the patient had a very large bowel movement. She is currently able to sit  on the toilet and passed stool. Appears her impaction is resolved.  I will discharge the patient back to her facility. I discussed with her that should she have recurrence of her symptoms she should return to the ER, she has vomiting, fever, abdominal pain, or other new concerns arise.  FINAL CLINICAL IMPRESSION(S) / ED DIAGNOSES  Final diagnoses:  Constipation   fecal impaction    Delman Kitten, MD 04/05/15 2244

## 2015-10-01 IMAGING — CR PELVIS - 1-2 VIEW
1 series · 1 of 1 positions shown · non-contrast
Comparison: Pelvis radiograph performed 12/30/2014

CLINICAL DATA: Acute onset of right hip pain.  Initial encounter.

EXAM:
PELVIS - 1-2 VIEW

[dxr pelvis ap only]
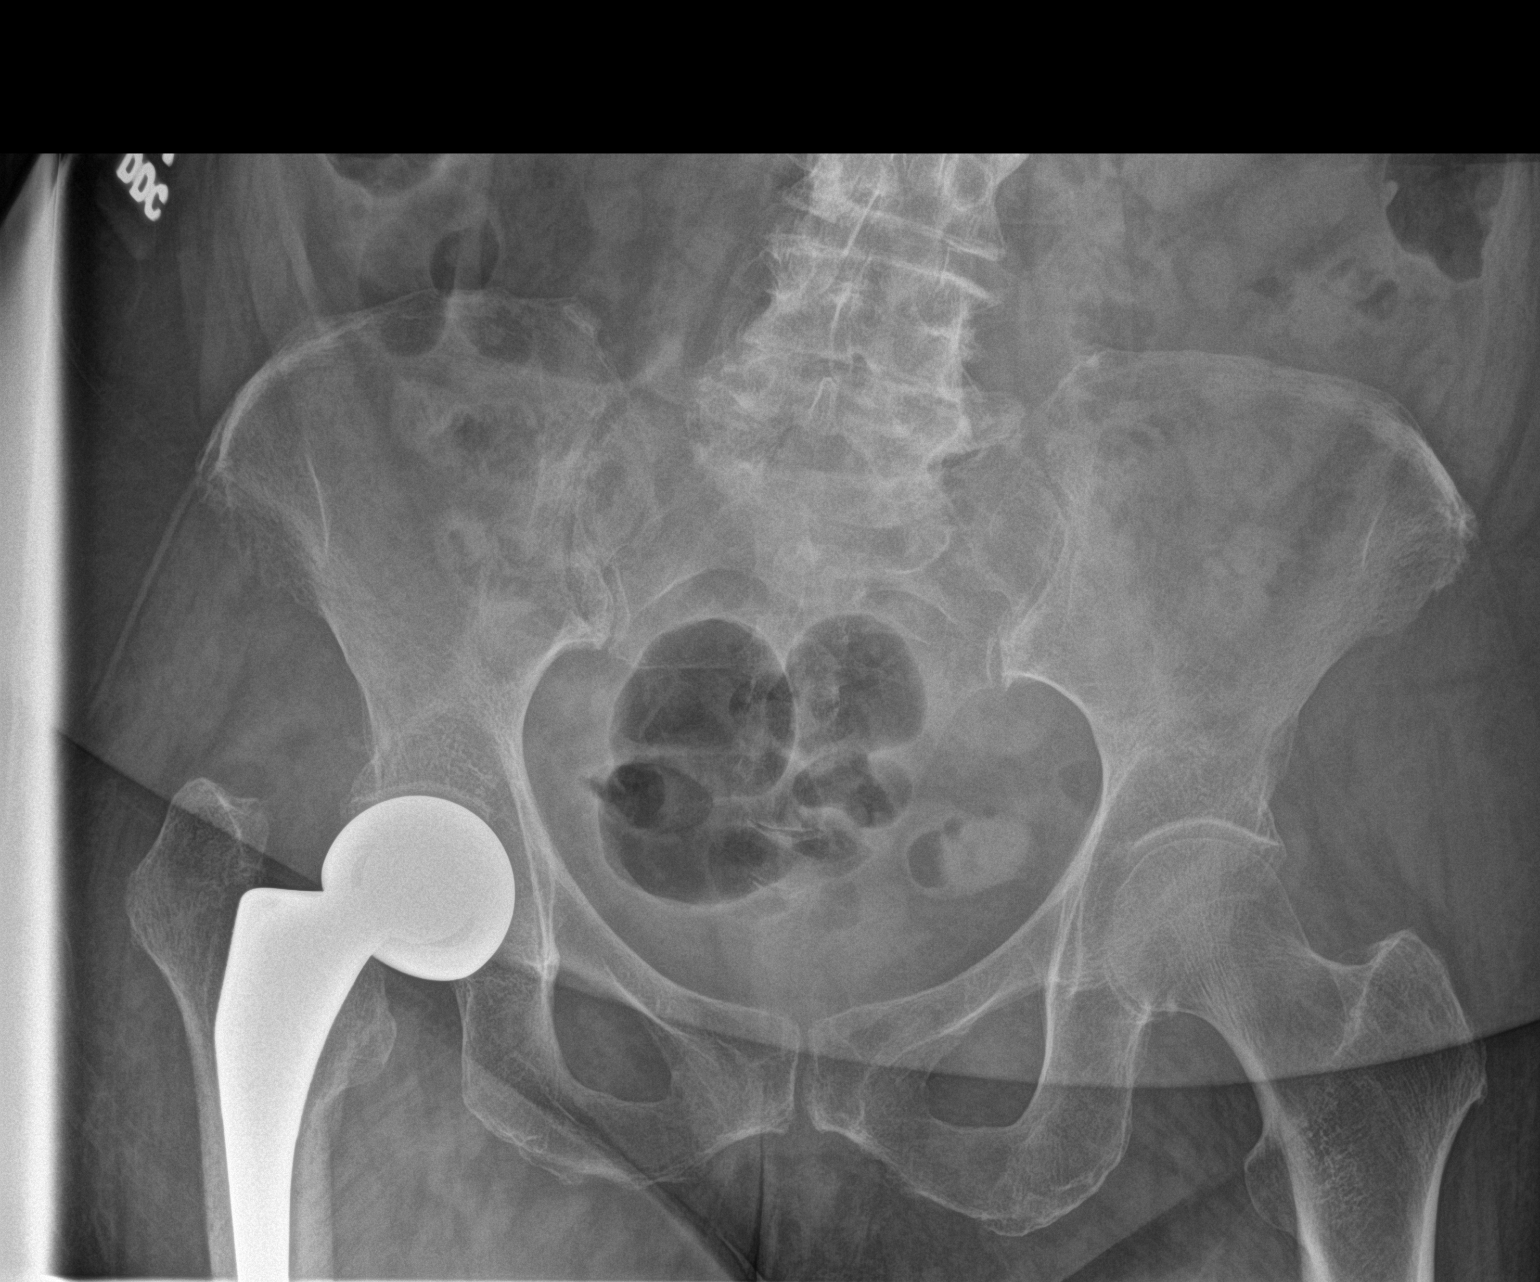

[1 of 1 positions shown; findings below may reference images not displayed]

FINDINGS: There is mild new loosening of the femoral component of the
patient's right hip prosthesis, measuring up to 4 mm laterally. The
right hip prosthesis is otherwise unremarkable. There is no evidence
of fracture or dislocation. The left hip joint is unremarkable in
appearance.

Mild sclerotic change is seen at the sacroiliac joints. Minimal
degenerative change is noted along the lower lumbar spine. The
visualized bowel gas pattern is grossly unremarkable.
IMPRESSION: Mild new loosening of the femoral component of the right hip
prosthesis, measuring up to 4 mm laterally. No evidence of fracture
or dislocation.

## 2015-11-11 ENCOUNTER — Emergency Department
Admission: EM | Admit: 2015-11-11 | Discharge: 2015-11-11 | Payer: Medicare PPO | Attending: Emergency Medicine | Admitting: Emergency Medicine

## 2015-11-11 ENCOUNTER — Emergency Department: Payer: Medicare PPO

## 2015-11-11 ENCOUNTER — Encounter: Payer: Self-pay | Admitting: Emergency Medicine

## 2015-11-11 ENCOUNTER — Other Ambulatory Visit: Payer: Self-pay

## 2015-11-11 DIAGNOSIS — W1839XA Other fall on same level, initial encounter: Secondary | ICD-10-CM | POA: Insufficient documentation

## 2015-11-11 DIAGNOSIS — Z7982 Long term (current) use of aspirin: Secondary | ICD-10-CM | POA: Diagnosis not present

## 2015-11-11 DIAGNOSIS — S0083XA Contusion of other part of head, initial encounter: Secondary | ICD-10-CM | POA: Insufficient documentation

## 2015-11-11 DIAGNOSIS — Z79899 Other long term (current) drug therapy: Secondary | ICD-10-CM | POA: Diagnosis not present

## 2015-11-11 DIAGNOSIS — S0081XA Abrasion of other part of head, initial encounter: Secondary | ICD-10-CM | POA: Insufficient documentation

## 2015-11-11 DIAGNOSIS — S066X0A Traumatic subarachnoid hemorrhage without loss of consciousness, initial encounter: Secondary | ICD-10-CM | POA: Insufficient documentation

## 2015-11-11 DIAGNOSIS — Y9289 Other specified places as the place of occurrence of the external cause: Secondary | ICD-10-CM | POA: Diagnosis not present

## 2015-11-11 DIAGNOSIS — Y998 Other external cause status: Secondary | ICD-10-CM | POA: Insufficient documentation

## 2015-11-11 DIAGNOSIS — W19XXXA Unspecified fall, initial encounter: Secondary | ICD-10-CM

## 2015-11-11 DIAGNOSIS — Y9389 Activity, other specified: Secondary | ICD-10-CM | POA: Insufficient documentation

## 2015-11-11 DIAGNOSIS — S59912A Unspecified injury of left forearm, initial encounter: Secondary | ICD-10-CM | POA: Diagnosis not present

## 2015-11-11 DIAGNOSIS — I609 Nontraumatic subarachnoid hemorrhage, unspecified: Secondary | ICD-10-CM

## 2015-11-11 DIAGNOSIS — Z23 Encounter for immunization: Secondary | ICD-10-CM | POA: Diagnosis not present

## 2015-11-11 DIAGNOSIS — S0993XA Unspecified injury of face, initial encounter: Secondary | ICD-10-CM | POA: Diagnosis present

## 2015-11-11 HISTORY — DX: Bipolar disorder, unspecified: F31.9

## 2015-11-11 HISTORY — DX: Disorder of thyroid, unspecified: E07.9

## 2015-11-11 LAB — CBC WITH DIFFERENTIAL/PLATELET
BASOS ABS: 0 10*3/uL (ref 0–0.1)
Basophils Relative: 0 %
EOS ABS: 0.1 10*3/uL (ref 0–0.7)
EOS PCT: 1 %
HCT: 36.7 % (ref 35.0–47.0)
Hemoglobin: 12.1 g/dL (ref 12.0–16.0)
Lymphocytes Relative: 20 %
Lymphs Abs: 2.3 10*3/uL (ref 1.0–3.6)
MCH: 30.6 pg (ref 26.0–34.0)
MCHC: 33.1 g/dL (ref 32.0–36.0)
MCV: 92.6 fL (ref 80.0–100.0)
Monocytes Absolute: 0.6 10*3/uL (ref 0.2–0.9)
Monocytes Relative: 6 %
Neutro Abs: 8.3 10*3/uL — ABNORMAL HIGH (ref 1.4–6.5)
Neutrophils Relative %: 73 %
PLATELETS: 262 10*3/uL (ref 150–440)
RBC: 3.96 MIL/uL (ref 3.80–5.20)
RDW: 13.6 % (ref 11.5–14.5)
WBC: 11.3 10*3/uL — ABNORMAL HIGH (ref 3.6–11.0)

## 2015-11-11 LAB — COMPREHENSIVE METABOLIC PANEL
ALT: 28 U/L (ref 14–54)
AST: 26 U/L (ref 15–41)
Albumin: 3.8 g/dL (ref 3.5–5.0)
Alkaline Phosphatase: 69 U/L (ref 38–126)
Anion gap: 7 (ref 5–15)
BILIRUBIN TOTAL: 0.8 mg/dL (ref 0.3–1.2)
BUN: 22 mg/dL — ABNORMAL HIGH (ref 6–20)
CALCIUM: 9.7 mg/dL (ref 8.9–10.3)
CO2: 23 mmol/L (ref 22–32)
Chloride: 108 mmol/L (ref 101–111)
Creatinine, Ser: 0.9 mg/dL (ref 0.44–1.00)
GFR calc Af Amer: >60 mL/min (ref >60)
Glucose, Bld: 94 mg/dL (ref 65–99)
POTASSIUM: 4.6 mmol/L (ref 3.5–5.1)
Sodium: 138 mmol/L (ref 135–145)
TOTAL PROTEIN: 7.4 g/dL (ref 6.5–8.1)

## 2015-11-11 LAB — TROPONIN I

## 2015-11-11 MED ORDER — TETANUS-DIPHTH-ACELL PERTUSSIS 5-2.5-18.5 LF-MCG/0.5 IM SUSP
0.5000 mL | Freq: Once | INTRAMUSCULAR | Status: AC
  Administered 2015-11-11: 0.5 mL via INTRAMUSCULAR
  Filled 2015-11-11: qty 0.5

## 2015-11-11 MED ORDER — TETANUS-DIPHTHERIA TOXOIDS TD 5-2 LFU IM INJ: 0.5000 mL | INJECTION | Freq: Once | INTRAMUSCULAR | Status: DC

## 2015-11-11 NOTE — ED Notes (Signed)
Called [facility] transfer Center for transfer spoke to [facility]

## 2015-11-11 NOTE — ED Provider Notes (Addendum)
[facility] Emergency Department Provider Note  ____________________________________________  Time seen: Approximately 2:54 PM  I have reviewed the triage vital signs and the nursing notes.   HISTORY  Chief Complaint Fall    HPI [name] is a 75 y.o. female patient reports she was standing and fell she is not sure why she said she didn't trip she didn't lose her balance she also says she did not pass out. She just doesn't know why she fell. She has abrasions and bruising on the right side of her face.   Past Medical History  Diagnosis Date  . Thyroid disease   . Bipolar 1 disorder ([place])     There are no active problems to display for this patient.   No past surgical history on file.  Current Outpatient Rx  Name  Route  Sig  Dispense  Refill  . acetaminophen (TYLENOL) 325 MG tablet   Oral   Take 650 mg by mouth every 4 (four) hours as needed for mild pain, fever or headache.         . ARIPiprazole (ABILIFY) 2 MG tablet   Oral   Take 2 mg by mouth daily.         Marland Kitchen aspirin 81 MG chewable tablet   Oral   Chew 81 mg by mouth daily.         . Biotin 1000 MCG tablet   Oral   Take 1,000 mcg by mouth 3 (three) times daily.         . busPIRone (BUSPAR) 15 MG tablet   Oral   Take 15 mg by mouth 3 (three) times daily.          . cholecalciferol (VITAMIN D) 1000 units tablet   Oral   Take 2,000 Units by mouth daily.         Marland Kitchen docusate sodium (COLACE) 100 MG capsule   Oral   Take 100 mg by mouth 2 (two) times daily.          Marland Kitchen levothyroxine (SYNTHROID, LEVOTHROID) 88 MCG tablet   Oral   Take 88 mcg by mouth daily before breakfast.         . lisinopril (PRINIVIL,ZESTRIL) 5 MG tablet   Oral   Take 5 mg by mouth daily.         Marland Kitchen losartan (COZAAR) 100 MG tablet   Oral   Take 100 mg by mouth daily.         . megestrol (MEGACE) 40 MG/ML suspension   Oral   Take 400 mg by mouth daily.          . Multiple  Vitamins-Minerals (MULTIVITAMIN WITH MINERALS) tablet   Oral   Take 1 tablet by mouth daily.         Marland Kitchen oxyCODONE (OXY IR/ROXICODONE) 5 MG immediate release tablet   Oral   Take 5 mg by mouth every 4 (four) hours as needed for severe pain.         . ranitidine (ZANTAC) 150 MG tablet   Oral   Take 150 mg by mouth 2 (two) times daily.         Marland Kitchen venlafaxine XR (EFFEXOR-XR) 150 MG 24 hr capsule   Oral   Take 150 mg by mouth daily.           Allergies Review of patient's allergies indicates no known allergies.  No family history on file.  Social History Social History  Substance Use Topics  .  Smoking status: Never Smoker   . Smokeless tobacco: Never Used  . Alcohol Use: No    Review of Systems Constitutional: No fever/chills Eyes: No visual changes. ENT: No sore throat. Cardiovascular: Denies chest pain. Respiratory: Denies shortness of breath. Gastrointestinal: No abdominal pain.  No nausea, no vomiting.  No diarrhea.  No constipation. Genitourinary: Negative for dysuria. Musculoskeletal: Negative for back pain. Skin: Negative for rash. Patient does have an area on her left forearm which is scabbed over she says she picks at it a lot there is fairly depressed scar in that area too.  10-point ROS otherwise negative.  ____________________________________________   PHYSICAL EXAM:  VITAL SIGNS: ED Triage Vitals  Enc Vitals Group     BP 11/11/15 1451 129/68 mmHg     Pulse Rate 11/11/15 1451 73     Resp 11/11/15 1451 16     Temp 11/11/15 1451 98.2 F (36.8 C)     Temp Source 11/11/15 1451 Oral     SpO2 11/11/15 1451 96 %     Weight 11/11/15 1451 165 lb (74.844 kg)     Height 11/11/15 1451 5\' 2"  (1.575 m)     Head Cir --      Peak Flow --      Pain Score 11/11/15 1452 8     Pain Loc --      Pain Edu? --      Excl. in [place]? --     Constitutional: Alert and oriented. Well appearing and in no acute distress. Eyes: Conjunctivae are normal. PERRL.  EOMI. Head: Patient has abrasions and bruises on the right side of her face after the fall Nose: No congestion/rhinnorhea. Mouth/Throat: Mucous membranes are moist.  Oropharynx non-erythematous. Neck: No stridor.   Cardiovascular: Normal rate, regular rhythm. Grossly normal heart sounds.  Good peripheral circulation. Respiratory: Normal respiratory effort.  No retractions. Lungs CTAB. Gastrointestinal: Soft and nontender. No distention. No abdominal bruits. No CVA tenderness. Musculoskeletal: No lower extremity tenderness nor edema.  No joint effusions. Chest no chest wall tenderness Neurologic:  Normal speech and language. No gross focal neurologic deficits are appreciated. No gait instability. Skin:  Skin is warm, dry and intact. No rash noted. Psychiatric: Mood and affect are normal. Speech and behavior are normal.  ____________________________________________   LABS (all labs ordered are listed, but only abnormal results are displayed)  Labs Reviewed  COMPREHENSIVE METABOLIC PANEL - Abnormal; Notable for the following:    BUN 22 (*)    All other components within normal limits  CBC WITH DIFFERENTIAL/PLATELET - Abnormal; Notable for the following:    WBC 11.3 (*)    Neutro Abs 8.3 (*)    All other components within normal limits  TROPONIN I   ____________________________________________  EKG  EKG read and interpreted by me shows normal sinus rhythm a rate of 67 normal axis computer is reading incomplete right bundle branch block with QRS duration 112 ms no acute ST-T wave changes EKG is very similar to previous one. ____________________________________________  RADIOLOGY  Chest x-ray is read as normal by radiology CT of the face is read as no acute fractures by radiology CT of the neck shows no acute fractures per radiology Chest x-ray shows no acute pathology per radiology X-ray of the hand shows a nondisplaced fracture of the base of the proximal phalanx per  radiology  ____________________________________________   PROCEDURES  Reevaluation of the patient shows what appears to be a new dilation of the right pupil. Patient is otherwise  normal neurological exam is otherwise normal except for her little finger on the right side hurts now.  Repeat CT no longer shows evidence of hemorrhage and not sure what this is why this is happening unless the CT slices were aligned differently and missed extremely small punctate hemorrhages perhaps since she had the first CT be positive ____________________________________________   INITIAL IMPRESSION / ASSESSMENT AND PLAN / ED COURSE  Pertinent labs & imaging results that were available during my care of the patient were reviewed by me and considered in my medical decision making (see chart for details).   ____________________________________________   FINAL CLINICAL IMPRESSION(S) / ED DIAGNOSES  Final diagnoses:  Fall, initial encounter  Subarachnoid hemorrhage [facility])         Nena Polio, MD 11/11/15 1906  Additional diagnosis nondisplaced fracture of the fifth right  Nena Polio, MD 11/11/15 1907

## 2015-11-11 NOTE — ED Notes (Signed)
This RN called patient's daughter-in-law to inform her of the plan to transfer to [facility].  Daughter-in-law verbalized understanding.

## 2015-11-11 NOTE — ED Notes (Signed)
Patient presents to the ED post fall via [facility] from the independent living part of the [facility].  Patient fell while using her walker and hit her face of the floor.  Patient has an abrasion to the right side of her forehead and her right eye.  Patient is complaining of bilateral knee pain.  Patient's grip strengths are equal.  Patient is able to move both feet and legs.  Patient is in no obvious distress at this time.

## 2015-12-02 ENCOUNTER — Ambulatory Visit
Admission: RE | Admit: 2015-12-02 | Discharge: 2015-12-02 | Disposition: A | Discharge status: Home / Self Care | Payer: Medicare PPO | Source: Ambulatory Visit | Attending: Internal Medicine | Admitting: Internal Medicine

## 2015-12-02 ENCOUNTER — Other Ambulatory Visit: Payer: Self-pay | Admitting: Internal Medicine

## 2015-12-02 DIAGNOSIS — G319 Degenerative disease of nervous system, unspecified: Secondary | ICD-10-CM | POA: Diagnosis not present

## 2015-12-02 DIAGNOSIS — I609 Nontraumatic subarachnoid hemorrhage, unspecified: Secondary | ICD-10-CM | POA: Diagnosis present

## 2015-12-02 DIAGNOSIS — Z8673 Personal history of transient ischemic attack (TIA), and cerebral infarction without residual deficits: Secondary | ICD-10-CM | POA: Insufficient documentation

## 2016-03-01 DIAGNOSIS — K5909 Other constipation: Secondary | ICD-10-CM | POA: Insufficient documentation

## 2016-08-06 IMAGING — CT CT HEAD W/O CM
1 series · 16 of 28 positions shown, 20 images · non-contrast
Comparison: 11/11/2015

CLINICAL DATA: Hypertension, confusion, altered mental status,
recent small subarachnoid hemorrhage.

EXAM:
CT HEAD WITHOUT CONTRAST
TECHNIQUE: Contiguous axial images were obtained from the base of the skull
through the vertex without contrast.

[Series 2: soft tissue · axial · 0.42mm/px · z∈[+142,+268]mm · 16 of 28 slices shown, 20 images]
[im 2/28  brain]
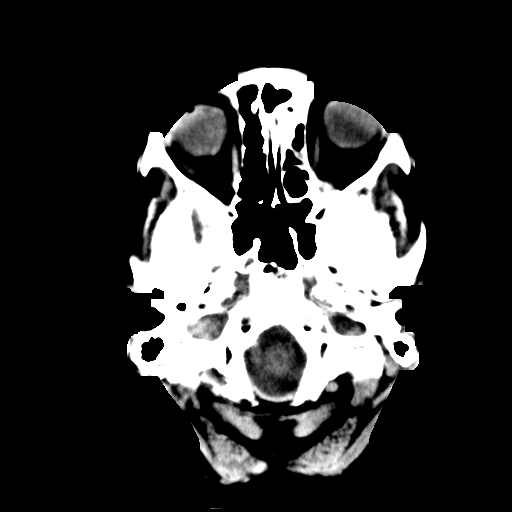
[im 2/28  bone]
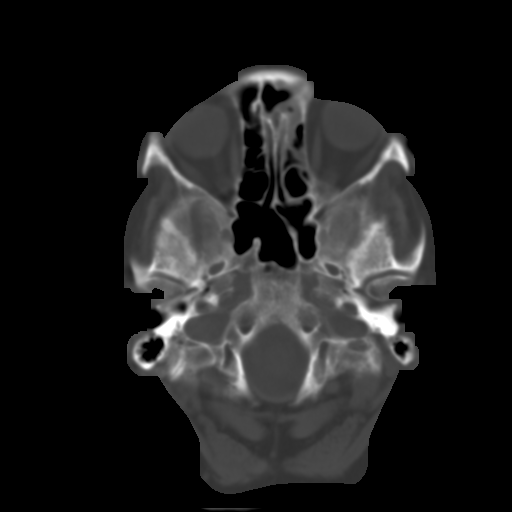
[im 4/28  brain]
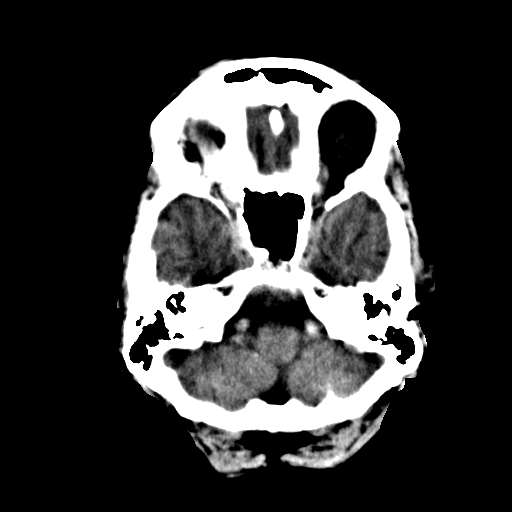
[im 6/28  brain]
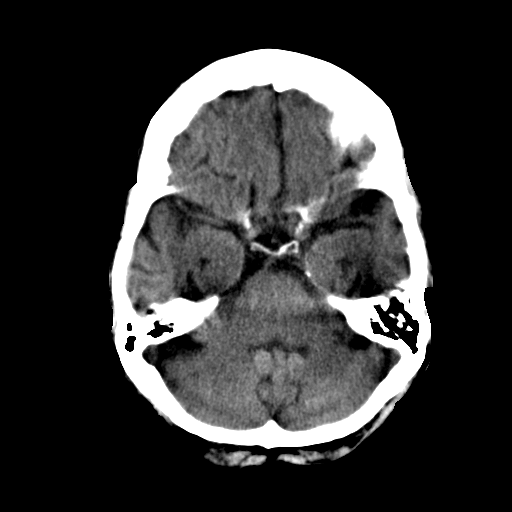
[im 7/28  brain]
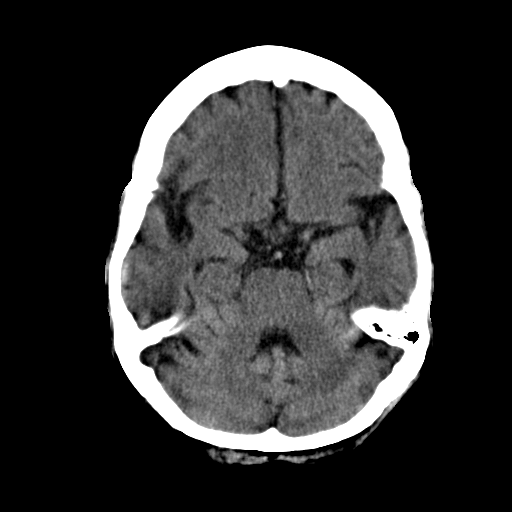
[im 9/28  brain]
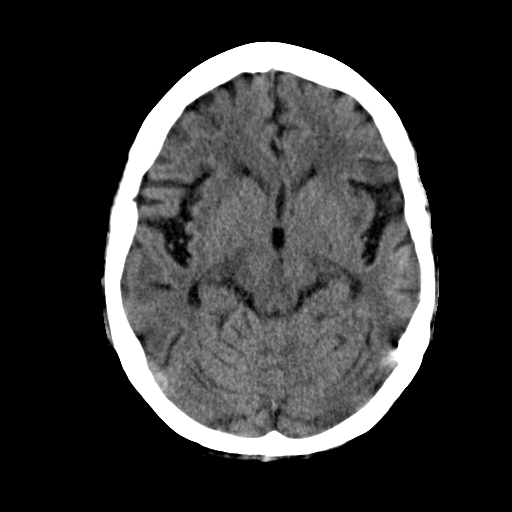
[im 9/28  bone]
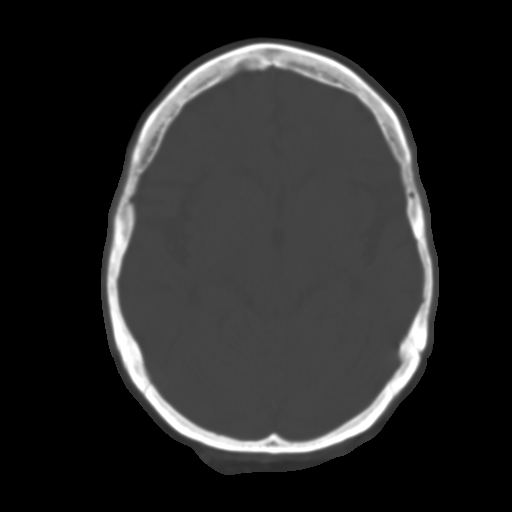
[im 10/28  brain]
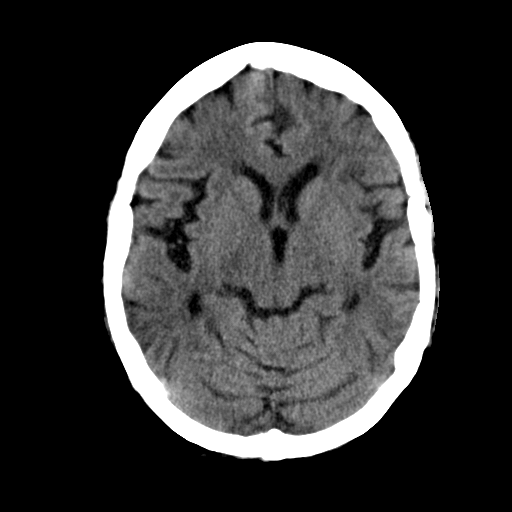
[im 12/28  brain]
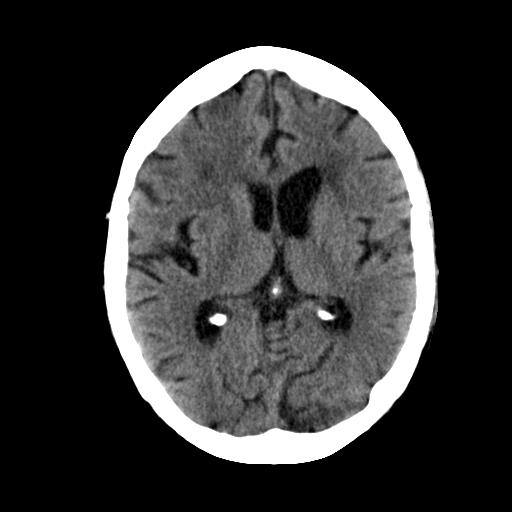
[im 14/28  brain]
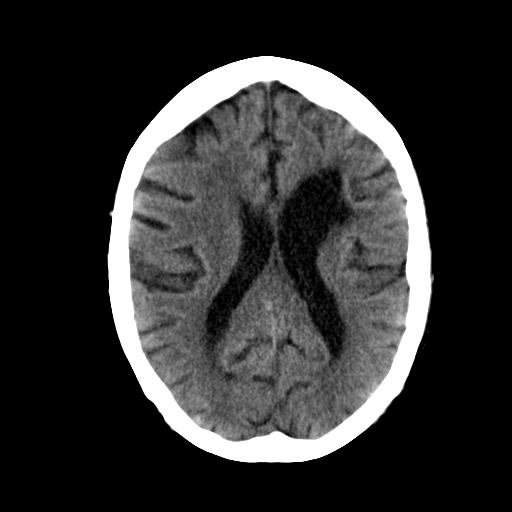
[im 15/28  brain]
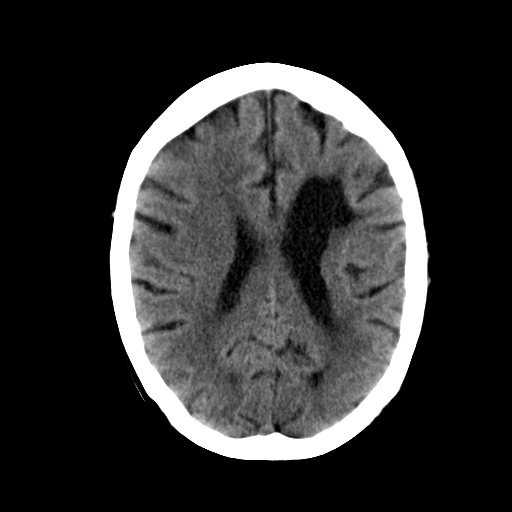
[im 15/28  bone]
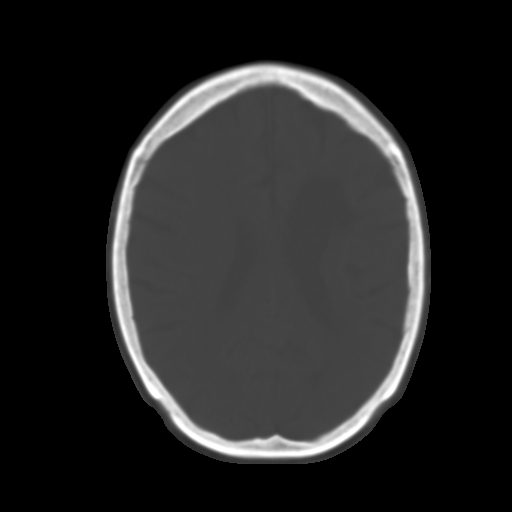
[im 17/28  brain]
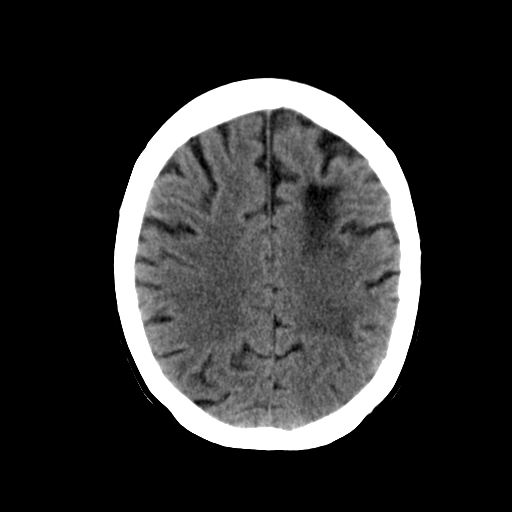
[im 19/28  brain]
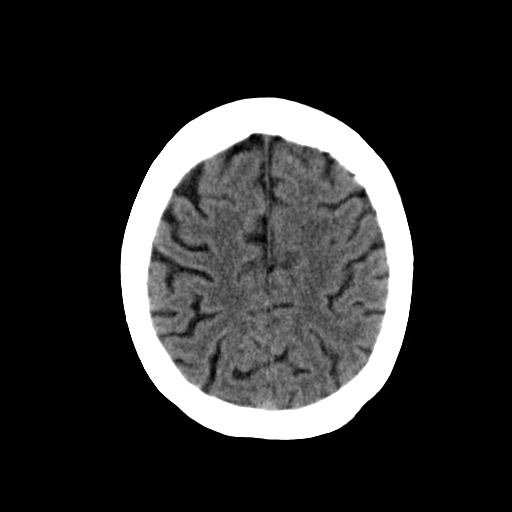
[im 20/28  brain]
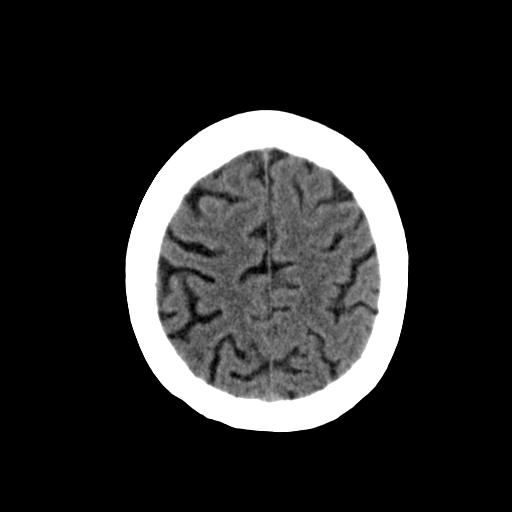
[im 22/28  brain]
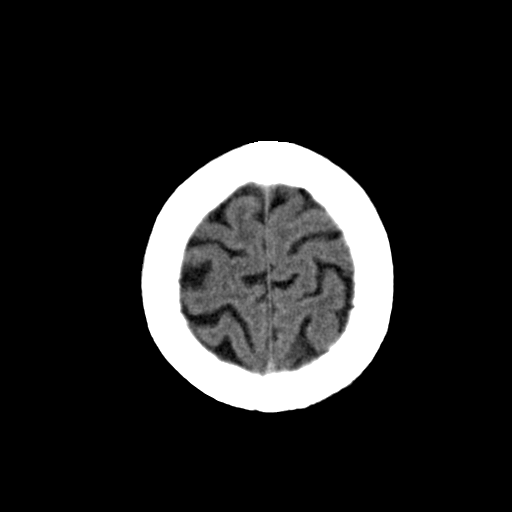
[im 22/28  bone]
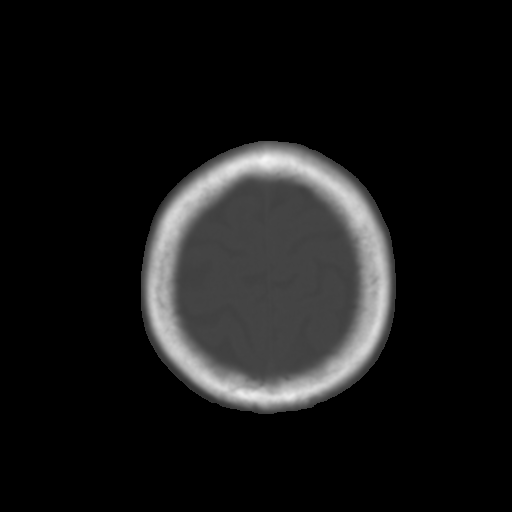
[im 23/28  brain]
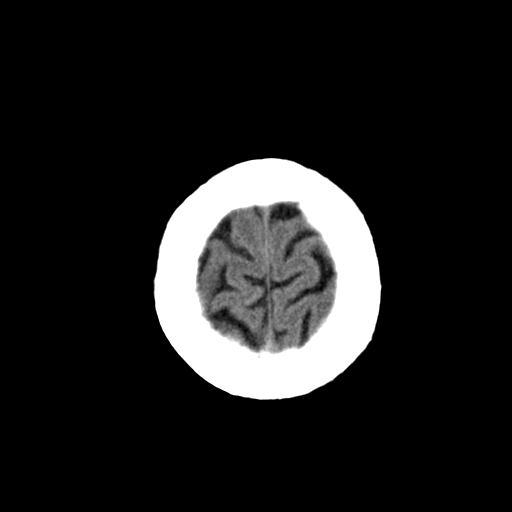
[im 25/28  brain]
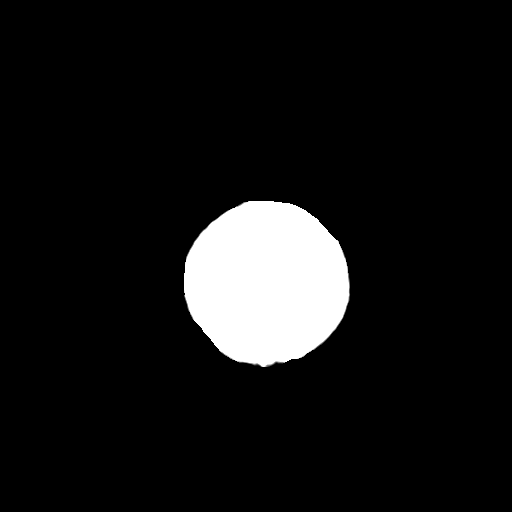
[im 27/28  brain]
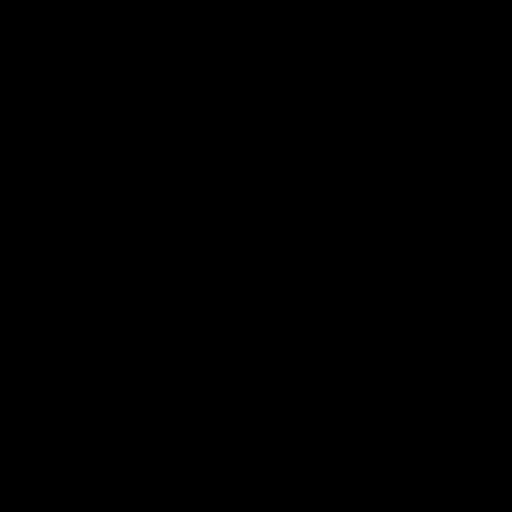

[16 of 28 positions shown; findings below may reference images not displayed]

FINDINGS: Stable atrophy and chronic periventricular white matter
microvascular ischemic changes. Remote left frontal white matter
infarct with encephalomalacia and ex vacuo dilatation of the left
lateral ventricle frontal horn. No current acute intracranial
hemorrhage, mass lesion, new infarction, midline shift, herniation,
hydrocephalus, or extra-axial fluid collection. No focal mass effect
or edema. Cisterns are patent. Cerebellar atrophy as well. Skull
appears intact. Mastoids are clear. Minor left ethmoid mucosal
thickening. Other sinuses clear.
IMPRESSION: Atrophy and chronic white matter microvascular ischemic changes.
Remote left frontal white matter periventricular infarct.

No current acute intracranial process by noncontrast CT.

## 2016-08-21 ENCOUNTER — Encounter: Payer: Medicare PPO | Attending: Internal Medicine | Admitting: Internal Medicine

## 2016-08-21 DIAGNOSIS — I10 Essential (primary) hypertension: Secondary | ICD-10-CM | POA: Insufficient documentation

## 2016-08-21 DIAGNOSIS — E785 Hyperlipidemia, unspecified: Secondary | ICD-10-CM | POA: Diagnosis not present

## 2016-08-21 DIAGNOSIS — E669 Obesity, unspecified: Secondary | ICD-10-CM | POA: Insufficient documentation

## 2016-08-21 DIAGNOSIS — G629 Polyneuropathy, unspecified: Secondary | ICD-10-CM | POA: Insufficient documentation

## 2016-08-21 DIAGNOSIS — B354 Tinea corporis: Secondary | ICD-10-CM | POA: Insufficient documentation

## 2016-08-21 DIAGNOSIS — M3310 Other dermatopolymyositis, organ involvement unspecified: Secondary | ICD-10-CM | POA: Insufficient documentation

## 2016-08-23 NOTE — Progress Notes (Signed)
[name] ([id number]) Visit Report for 08/21/2016 Allergy List Details Patient Name: [name], [name]. Date of Service: 08/21/2016 9:30 AM Medical Record Patient Account Number: [id number] Number: Treating RN: [name] 03-Sep-1941 (75 y.o. Other Clinician: Date of Birth/Sex: Female) Treating [name] Primary Care [name]: [name] [name]/Extender: [name]: [name] in Treatment: 0 Allergies Active Allergies NKDA Allergy Notes Electronic Signature(s) Signed: 08/22/2016 5:37:51 PM By: [name] Entered By: [name] on 08/21/2016 09:47:36 [name], Jillian Mound ([id number]) -------------------------------------------------------------------------------- Arrival Information Details Patient Name: [name] Date of Service: 08/21/2016 9:30 AM Medical Record Patient Account Number: [id number] Number: Treating RN: [name] 1941-07-19 (75 y.o. Other Clinician: Date of Birth/Sex: Female) Treating [name] Primary Care [name]: [name] [name]/Extender: [name]: [name] in Treatment: 0 Visit Information Patient Arrived: Walker Arrival Time: 09:40 Accompanied By: caregiver Transfer Assistance: None Patient Identification Verified: Yes Secondary Verification Process Yes Completed: Patient Requires Transmission-Based No Precautions: Patient Has Alerts: No Electronic Signature(s) Signed: 08/22/2016 5:37:51 PM By: [name] Entered By: [name] on 08/21/2016 09:44:09 [name], [name] ([id number]) -------------------------------------------------------------------------------- Clinic [name] of Care Assessment Details Patient Name: [name], Jillian Mound. Date of Service: 08/21/2016 9:30 AM Medical Record Patient Account Number: [id number] Number: Treating RN: [name] 1941/06/17 (75 y.o. Other  Clinician: Date of Birth/Sex: Female) Treating [name] Primary Care [name]: [name] [name]/Extender: [name]: [name] in Treatment: 0 Clinic [name] of Care Assessment Items TOOL 2 Quantity Score X - Use when only an EandM is performed on the INITIAL visit 1 0 ASSESSMENTS - Nursing Assessment / Reassessment X - General Physical Exam (combine w/ comprehensive assessment (listed just 1 20 below) when performed on new pt. evals) X - Comprehensive Assessment (HX, ROS, Risk Assessments, Wounds Hx, etc.) 1 25 ASSESSMENTS - Wound and Skin Assessment / Reassessment X - Simple Wound Assessment / Reassessment - one wound 1 5 []  - Complex Wound Assessment / Reassessment - multiple wounds 0 []  - Dermatologic / Skin Assessment (not related to wound area) 0 ASSESSMENTS - Ostomy and/or Continence Assessment and Care []  - Incontinence Assessment and Management 0 []  - Ostomy Care Assessment and Management (repouching, etc.) 0 PROCESS - Coordination of Care []  - Simple Patient / Family Education for ongoing care 0 X - Complex (extensive) Patient / Family Education for ongoing care 1 20 X - Staff obtains Programmer, systems, Records, Test Results / Process Orders 1 10 []  - Staff telephones HHA, Nursing Homes / Clarify orders / etc 0 []  - Routine Transfer to another Facility (non-emergent condition) 0 []  - Routine Hospital Admission (non-emergent condition) 0 X - New Admissions / Biomedical engineer / Ordering NPWT, Apligraf, etc. 1 15 []  - Emergency Hospital Admission (emergent condition) 0 [name], Biana Jillian. ([id number]) X - Simple Discharge Coordination 1 10 []  - Complex (extensive) Discharge Coordination 0 PROCESS - Special Needs []  - Pediatric / Minor Patient Management 0 []  - Isolation Patient Management 0 []  - Hearing / Language / Visual special needs 0 []  - Assessment of Community assistance (transportation, D/C planning, etc.) 0 []  - Additional  assistance / Altered mentation 0 []  - Support Surface(s) Assessment (bed, cushion, seat, etc.) 0 INTERVENTIONS - Wound Cleansing / Measurement X - Wound Imaging (photographs - any number of wounds) 1 5 []  - Wound Tracing (instead of photographs) 0 X - Simple Wound Measurement - one wound 1 5 []  - Complex Wound Measurement - multiple wounds 0 X -  Simple Wound Cleansing - one wound 1 5 []  - Complex Wound Cleansing - multiple wounds 0 INTERVENTIONS - Wound Dressings X - Small Wound Dressing one or multiple wounds 1 10 []  - Medium Wound Dressing one or multiple wounds 0 []  - Large Wound Dressing one or multiple wounds 0 []  - Application of Medications - injection 0 INTERVENTIONS - Miscellaneous []  - External ear exam 0 []  - Specimen Collection (cultures, biopsies, blood, body fluids, etc.) 0 []  - Specimen(s) / Culture(s) sent or taken to Lab for analysis 0 []  - Patient Transfer (multiple staff / Harrel Lemon Lift / Similar devices) 0 []  - Simple Staple / Suture removal (25 or less) 0 [name], Annasofia Jillian. ([id number]) []  - Complex Staple / Suture removal (26 or more) 0 []  - Hypo / Hyperglycemic Management (close monitor of Blood Glucose) 0 []  - Ankle / Brachial Index (ABI) - do not check if billed separately 0 Has the patient been seen at the hospital within the last three years: Yes Total Score: 130 [name] Of Care: New/Established - [name] 4 Electronic Signature(s) Signed: 08/22/2016 5:37:51 PM By: [name] Entered By: [name] on 08/21/2016 15:55:34 [name], Special [name] ([id number]) -------------------------------------------------------------------------------- Encounter Discharge Information Details Patient Name: [name] Date of Service: 08/21/2016 9:30 AM Medical Record Patient Account Number: [id number] Number: Treating RN: [name] 03/01/1941 (75 y.o. Other Clinician: Date of Birth/Sex: Female) Treating [name] Primary Care [name]:  [name] [name]/Extender: [name]: [name] in Treatment: 0 Encounter Discharge Information Items Discharge Pain [name]: 0 Discharge Condition: Stable Ambulatory Status: Walker Discharge Destination: Nursing Home Transportation: Other Accompanied By: caregiver Schedule Follow-up Appointment: Yes Medication Reconciliation completed and provided to Patient/Care No Caulin Begley: Provided on Clinical Summary of Care: 08/21/2016 Form Type Recipient Paper Patient CD Electronic Signature(s) Signed: 08/21/2016 10:42:14 AM By: Ruthine Dose Entered By: Ruthine Dose on 08/21/2016 10:42:14 Lopezmartinez, Niasia [name] ([id number]) -------------------------------------------------------------------------------- Lower Extremity Assessment Details Patient Name: Chasen, Jillian Mound. Date of Service: 08/21/2016 9:30 AM Medical Record Patient Account Number: [id number] Number: Treating RN: [name] 07/29/1941 (75 y.o. Other Clinician: Date of Birth/Sex: Female) Treating [name] Primary Care [name]: [name] [name]/Extender: [name]: [name] in Treatment: 0 Electronic Signature(s) Signed: 08/22/2016 5:37:51 PM By: [name] Entered By: [name] on 08/21/2016 09:47:08 Hirschfeld, Tekisha [name] ([id number]) -------------------------------------------------------------------------------- Multi Wound Chart Details Patient Name: Ruz, Jillian Mound. Date of Service: 08/21/2016 9:30 AM Medical Record Patient Account Number: [id number] Number: Treating RN: [name] [birth year] (75 y.o. Other Clinician: Date of Birth/Sex: Female) Treating ROBSON, [place] Primary Care [name]: [name] [name]/Extender: [name]: [name] in Treatment: 0 Vital Signs Height(in): 62 Pulse(bpm): 69 Weight(lbs): 167.1 Blood Pressure 133/77 (mmHg): Body Mass  Index(BMI): 31 Temperature(F): 97.8 Respiratory Rate 16 (breaths/min): Photos: [1:No Photos] [N/A:N/A] Wound Location: [1:Forearm - Posterior] [N/A:N/A] Wounding Event: [1:Gradually Appeared] [N/A:N/A] Primary Etiology: [1:To be determined] [N/A:N/A] Comorbid History: [1:Anemia, Hypertension, Phlebitis, Neuropathy] [N/A:N/A] Date Acquired: [1:06/21/2016] [N/A:N/A] Weeks of Treatment: [1:0] [N/A:N/A] Wound Status: [1:Open] [N/A:N/A] Measurements L x W x D 1.5x3x0.1 [N/A:N/A] (cm) Area (cm) : [1:3.534] [N/A:N/A] Volume (cm) : [1:0.353] [N/A:N/A] Classification: [1:Partial Thickness] [N/A:N/A] Exudate Amount: [1:Medium] [N/A:N/A] Exudate Type: [1:Serous] [N/A:N/A] Exudate Color: [1:amber] [N/A:N/A] Wound Margin: [1:Distinct, outline attached] [N/A:N/A] Granulation Amount: [1:Small (1-33%)] [N/A:N/A] Granulation Quality: [1:Pink] [N/A:N/A] Necrotic Amount: [1:Large (67-100%)] [N/A:N/A] Necrotic Tissue: [1:Eschar, Adherent Slough] [N/A:N/A] Exposed Structures: [1:Fascia: No Fat: No Tendon: No Muscle: No Joint: No Bone: No] [N/A:N/A] Limited to  Skin Breakdown Epithelialization: None N/A N/A Periwound Skin Texture: No Abnormalities Noted N/A N/A Periwound Skin Moist: Yes N/A N/A Moisture: Periwound Skin Color: No Abnormalities Noted N/A N/A Temperature: No Abnormality N/A N/A Tenderness on Yes N/A N/A Palpation: Wound Preparation: Ulcer Cleansing: N/A N/A Rinsed/Irrigated with Saline Topical Anesthetic Applied: Other: lidocaine 4% Treatment Notes Electronic Signature(s) Signed: 08/22/2016 5:37:51 PM By: [name] Entered By: [name] on 08/21/2016 10:09:45 Hollibaugh, Jillian Mound ([id number]) -------------------------------------------------------------------------------- [place] Details Patient Name: EVIENNE, HIBBERT Date of Service: 08/21/2016 9:30 AM Medical Record Patient Account Number: [id number] Number: Treating  RN: [name] [birth year] (75 y.o. Other Clinician: Date of Birth/Sex: Female) Treating [name] Primary Care [name]: [name] [name]/Extender: [name]: [name] in Treatment: 0 Active Inactive Abuse / Safety / Falls / Self Care Management Nursing Diagnoses: Potential for falls Goals: Patient will remain injury free Date Initiated: 08/21/2016 Goal Status: Active Interventions: Assess fall risk on admission and as needed Assess impairment of mobility on admission and as needed per policy Notes: Nutrition Nursing Diagnoses: Imbalanced nutrition Goals: Patient/caregiver agrees to and verbalizes understanding of need to use nutritional supplements and/or vitamins as prescribed Date Initiated: 08/21/2016 Goal Status: Active Interventions: Assess patient nutrition upon admission and as needed per policy Notes: Orientation to the Wound Care Program Nursing Diagnoses: Knowledge deficit related to the wound healing center program CHARNIECE, KWIAT ([id number]) Goals: Patient/caregiver will verbalize understanding of the [facility] Date Initiated: 08/21/2016 Goal Status: Active Interventions: Provide education on orientation to the wound center Notes: Wound/Skin Impairment Nursing Diagnoses: Impaired tissue integrity Goals: Ulcer/skin breakdown will have a volume reduction of 30% by week 4 Date Initiated: 08/21/2016 Goal Status: Active Ulcer/skin breakdown will have a volume reduction of 50% by week 8 Date Initiated: 08/21/2016 Goal Status: Active Ulcer/skin breakdown will have a volume reduction of 80% by week 12 Date Initiated: 08/21/2016 Goal Status: Active Interventions: Assess patient/caregiver ability to perform ulcer/skin care regimen upon admission and as needed Assess ulceration(s) every visit Notes: Electronic Signature(s) Signed: 08/22/2016 5:37:51 PM By: [name] Entered By:  [name] on 08/21/2016 10:09:25 Tabb, Joice [name] ([id number]) -------------------------------------------------------------------------------- Pain Assessment Details Patient Name: [name] Date of Service: 08/21/2016 9:30 AM Medical Record Patient Account Number: [id number] Number: Treating RN: [name] [birth year] (75 y.o. Other Clinician: Date of Birth/Sex: Female) Treating [name] Primary Care [name]: [name] [name]/Extender: [name]: [name] in Treatment: 0 Active Problems Location of Pain Severity and Description of Pain Patient Has Paino No Site Locations With Dressing Change: No Pain Management and Medication Current Pain Management: Electronic Signature(s) Signed: 08/22/2016 5:37:51 PM By: [name] Entered By: [name] on 08/21/2016 09:44:17 Ostrow, Jillian Mound ([id number]) -------------------------------------------------------------------------------- Patient/Caregiver Education Details Patient Name: [name] Date of Service: 08/21/2016 9:30 AM Medical Record Patient Account Number: [id number] Number: Treating RN: [name] 1941/05/17 (75 y.o. Other Clinician: Date of Birth/Gender: Female) Treating [name] Primary Care [name]: [name] [name]/Extender: [name]: [name] in Treatment: 0 Education Assessment Education Provided To: Patient and Caregiver Education Topics Provided Welcome [facility] [place]: Handouts: Welcome To [facility] [place] Methods: Explain/Verbal Responses: State content correctly Wound/Skin Impairment: Handouts: Other: change dressing as ordered Methods: Demonstration, Explain/Verbal Responses: State content correctly Electronic Signature(s) Signed: 08/22/2016 5:37:51 PM By: [name] Entered By: [name] on 08/21/2016 10:10:12 [place],  [place]. ([id number]) -------------------------------------------------------------------------------- Wound Assessment Details Patient Name: Doren Custard,  Kinzly Jillian. Date of Service: 08/21/2016 9:30 AM Medical Record Patient Account Number: [id number] Number: Treating RN: [name] [birth year] (75 y.o. Other Clinician: Date of Birth/Sex: Female) Treating ROBSON, [place] Primary Care [name]: [name] [name]/Extender: [name]: [name] in Treatment: 0 Wound Status Wound Number: 1 Primary To be determined Etiology: Wound Location: Forearm - Posterior Wound Status: Open Wounding Event: Gradually Appeared Comorbid Anemia, Hypertension, Phlebitis, Date Acquired: 06/21/2016 History: Neuropathy Weeks Of Treatment: 0 Clustered Wound: No Photos Photo Uploaded By: [name] on 08/21/2016 11:37:33 Wound Measurements Length: (cm) 1.5 Width: (cm) 3 Depth: (cm) 0.1 Area: (cm) 3.534 Volume: (cm) 0.353 % Reduction in Area: % Reduction in Volume: Epithelialization: None Tunneling: No Undermining: No Wound Description Classification: Partial Thickness Wound Margin: Distinct, outline attached Exudate Amount: Medium Exudate Type: Serous Exudate Color: amber Foul Odor After Cleansing: No Wound Bed Granulation Amount: Small (1-33%) Exposed Structure Granulation Quality: Pink Fascia Exposed: No Necrotic Amount: Large (67-100%) Fat Layer Exposed: No Jagodzinski, Nairi Jillian. ([id number]) Necrotic Quality: Eschar, Adherent Slough Tendon Exposed: No Muscle Exposed: No Joint Exposed: No Bone Exposed: No Limited to Skin Breakdown Periwound Skin Texture Texture Color No Abnormalities Noted: No No Abnormalities Noted: No Moisture Temperature / Pain No Abnormalities Noted: No Temperature: No Abnormality Moist: Yes Tenderness on Palpation: Yes Wound Preparation Ulcer Cleansing: Rinsed/Irrigated with Saline Topical Anesthetic  Applied: Other: lidocaine 4%, Treatment Notes Wound #1 (Posterior Forearm) 1. Cleansed with: Clean wound with Normal Saline 2. Anesthetic Topical Lidocaine 4% cream to wound bed prior to debridement 4. Dressing Applied: Other dressing (specify in notes) 5. Secondary Dressing Applied Kerlix/Conform Non-Adherent pad 7. Secured with Tape Notes nystatin cream, netting Electronic Signature(s) Signed: 08/22/2016 5:37:51 PM By: [name] Entered By: [name] on 08/21/2016 10:04:05 Oquinn, Jillian Mound ([id number]) -------------------------------------------------------------------------------- [facility] Details Patient Name: [name] Date of Service: 08/21/2016 9:30 AM Medical Record Patient Account Number: [id number] Number: Treating RN: [name] Jan 25, 1941 (75 y.o. Other Clinician: Date of Birth/Sex: Female) Treating ROBSON, [place] Primary Care [name]: [name] [name]/Extender: [name]: [name] in Treatment: 0 Vital Signs Time Taken: 09:44 Temperature (F): 97.8 Height (in): 62 Pulse (bpm): 69 Source: Stated Respiratory Rate (breaths/min): 16 Weight (lbs): 167.1 Blood Pressure (mmHg): 133/77 Source: Measured Reference Range: 80 - 120 mg / dl Body Mass Index (BMI): 30.6 Electronic Signature(s) Signed: 08/22/2016 5:37:51 PM By: [name] Entered By: [name] on 08/21/2016 09:46:36

## 2016-08-23 NOTE — Progress Notes (Signed)
[name], [name] ([id number]) Visit Report for 08/21/2016 Abuse/Suicide Risk Screen Details Patient Name: [name], [name]. Date of Service: 08/21/2016 9:30 AM Medical Record Patient Account Number: [id number] Number: Treating RN: Ahmed Prima 18-Feb-1941 (75 y.o. Other Clinician: Date of Birth/Sex: Female) Treating ROBSON, MICHAEL Primary Care Physician/Extender: Bo Merino Physician: Referring Physician: Betsey Holiday in Treatment: 0 Abuse/Suicide Risk Screen Items Answer ABUSE/SUICIDE RISK SCREEN: Has anyone close to you tried to hurt or harm you recentlyo No Do you feel uncomfortable with anyone in your familyo No Has anyone forced you do things that you didnot want to doo No Do you have any thoughts of harming yourselfo No Patient displays signs or symptoms of abuse and/or neglect. No Electronic Signature(s) Signed: 08/22/2016 5:37:51 PM By: Alric Quan Entered By: Alric Quan on 08/21/2016 09:58:53 [name], [name] Level ([id number]) -------------------------------------------------------------------------------- Activities of Daily Living Details Patient Name: [name], Jillian G. Date of Service: 08/21/2016 9:30 AM Medical Record Patient Account Number: [id number] Number: Treating RN: Ahmed Prima [birth year] (75 y.o. Other Clinician: Date of Birth/Sex: Female) Treating ROBSON, MICHAEL Primary Care Physician/Extender: Bo Merino Physician: Referring Physician: Betsey Holiday in Treatment: 0 Activities of Daily Living Items Answer Activities of Daily Living (Please select one for each item) Drive Automobile Not Able Take Medications Need Assistance Use Telephone Completely Able Care for Appearance Need Assistance Use Toilet Need Assistance Bath / Shower Need Assistance Dress Self Need Assistance Feed Self Completely Able Walk Need Assistance Get In / Out Bed Need Assistance Housework Not Able Prepare  Meals Not Able Handle Money Not Able Shop for Self Not Able Electronic Signature(s) Signed: 08/22/2016 5:37:51 PM By: Alric Quan Entered By: Alric Quan on 08/21/2016 09:59:32 Finkle, Eola [name] Level ([id number]) -------------------------------------------------------------------------------- Education Assessment Details Patient Name: [name] Date of Service: 08/21/2016 9:30 AM Medical Record Patient Account Number: [id number] Number: Treating RN: Ahmed Prima 11/09/1940 (75 y.o. Other Clinician: Date of Birth/Sex: Female) Treating ROBSON, MICHAEL Primary Care Physician/Extender: Bo Merino Physician: Referring Physician: Betsey Holiday in Treatment: 0 Primary Learner Assessed: Patient Learning Preferences/Education Level/Primary Language Learning Preference: Explanation, Printed Material Highest Education Level: College or Above Preferred Language: English Cognitive Barrier Assessment/Beliefs Language Barrier: No Translator Needed: No Memory Deficit: No Emotional Barrier: No Cultural/Religious Beliefs Affecting Medical No Care: Physical Barrier Assessment Impaired Vision: No Impaired Hearing: No Decreased Hand dexterity: No Knowledge/Comprehension Assessment Knowledge Level: High Comprehension Level: High Ability to understand written High instructions: Ability to understand verbal High instructions: Motivation Assessment Anxiety Level: Calm Cooperation: Cooperative Education Importance: Acknowledges Need Interest in Health Problems: Asks Questions Perception: Coherent Willingness to Engage in Self- High Management Activities: [name], [name] ([id number]) Readiness to Engage in Self- Management Activities: Electronic Signature(s) Signed: 08/22/2016 5:37:51 PM By: Alric Quan Entered By: Alric Quan on 08/21/2016 10:00:04 [name], [name]  ([id number]) -------------------------------------------------------------------------------- Fall Risk Assessment Details Patient Name: [name]. Date of Service: 08/21/2016 9:30 AM Medical Record Patient Account Number: [id number] Number: Treating RN: Ahmed Prima Jun 23, 1941 (75 y.o. Other Clinician: Date of Birth/Sex: Female) Treating ROBSON, MICHAEL Primary Care Physician/Extender: Bo Merino Physician: Referring Physician: Betsey Holiday in Treatment: 0 Fall Risk Assessment Items Have you had 2 or more falls in the last 12 monthso 0 No Have you had any fall that resulted in injury in the last 12 monthso 0 No FALL RISK ASSESSMENT: History of falling - immediate or within 3 months 0 No Secondary diagnosis 15 Yes Ambulatory aid None/bed rest/wheelchair/nurse 0  No Crutches/cane/walker 15 Yes Furniture 0 No IV Access/Saline Lock 0 No Gait/Training Normal/bed rest/immobile 0 No Weak 0 No Impaired 20 Yes Mental Status Oriented to own ability 0 Yes Electronic Signature(s) Signed: 08/22/2016 5:37:51 PM By: Alric Quan Entered By: Alric Quan on 08/21/2016 10:00:41 [name], [name] Level ([id number]) -------------------------------------------------------------------------------- Foot Assessment Details Patient Name: [name], [name]. Date of Service: 08/21/2016 9:30 AM Medical Record Patient Account Number: [id number] Number: Treating RN: Ahmed Prima September 26, 1941 (75 y.o. Other Clinician: Date of Birth/Sex: Female) Treating ROBSON, MICHAEL Primary Care Physician/Extender: Bo Merino Physician: Referring Physician: Betsey Holiday in Treatment: 0 Foot Assessment Items Site Locations + = Sensation present, - = Sensation absent, C = Callus, U = Ulcer R = Redness, W = Warmth, M = Maceration, PU = Pre-ulcerative lesion F = Fissure, S = Swelling, D = Dryness Assessment Right: Left: Other Deformity: No  No Prior Foot Ulcer: No No Prior Amputation: No No Charcot Joint: No No Ambulatory Status: Gait: Electronic Signature(s) Signed: 08/22/2016 5:37:51 PM By: Alric Quan Entered By: Alric Quan on 08/21/2016 10:01:04 [name], [name] ([id number]) [name], [name] ([id number]) -------------------------------------------------------------------------------- Nutrition Risk Assessment Details Patient Name: [name], Jillian G. Date of Service: 08/21/2016 9:30 AM Medical Record Patient Account Number: [id number] Number: Treating RN: Ahmed Prima 02-Jan-1941 (75 y.o. Other Clinician: Date of Birth/Sex: Female) Treating ROBSON, MICHAEL Primary Care Physician/Extender: Bo Merino Physician: Referring Physician: Betsey Holiday in Treatment: 0 Height (in): 62 Weight (lbs): 167.1 Body Mass Index (BMI): 30.6 Nutrition Risk Assessment Items NUTRITION RISK SCREEN: I have an illness or condition that made me change the kind and/or 2 Yes amount of food I eat I eat fewer than two meals per day 0 No I eat few fruits and vegetables, or milk products 0 No I have three or more drinks of beer, liquor or wine almost every day 0 No I have tooth or mouth problems that make it hard for me to eat 0 No I don't always have enough money to buy the food I need 0 No I eat alone most of the time 0 No I take three or more different prescribed or over-the-counter drugs a 1 Yes day Without wanting to, I have lost or gained 10 pounds in the last six 0 No months I am not always physically able to shop, cook and/or feed myself 2 Yes Nutrition Protocols Good Risk Protocol Provide education on Moderate Risk Protocol 0 nutrition Electronic Signature(s) Signed: 08/22/2016 5:37:51 PM By: Alric Quan Entered By: Alric Quan on 08/21/2016 10:00:58

## 2016-08-23 NOTE — Progress Notes (Signed)
VARA, OSTERKAMP ([id number]) Visit Report for 08/21/2016 Chief Complaint Document Details Patient Name: [name], [name]. Date of Service: 08/21/2016 9:30 AM Medical Record Patient Account Number: [id number] Number: Treating RN: Ahmed Prima November 27, 1940 (75 y.o. Other Clinician: Date of Birth/Sex: Female) Treating Sylas Twombly Primary Care Physician/Extender: Bo Merino Physician: Referring Physician: Betsey Holiday in Treatment: 0 Information Obtained from: Patient Chief Complaint 08/21/16; patient is here for review of a skin rash on her left arm. Electronic Signature(s) Signed: 08/21/2016 5:12:34 PM By: Linton Ham MD Entered By: Linton Ham on 08/21/2016 11:27:01 Portner, [name] ([id number]) -------------------------------------------------------------------------------- HPI Details Patient Name: Jillian, [name] Date of Service: 08/21/2016 9:30 AM Medical Record Patient Account Number: [id number] Number: Treating RN: Ahmed Prima [birth year] (75 y.o. Other Clinician: Date of Birth/Sex: Female) Treating Glori Machnik Primary Care Physician/Extender: Bo Merino Physician: Referring Physician: Betsey Holiday in Treatment: 0 History of Present Illness HPI Description: 08/21/16; his is a very pleasant 75 year old woman who lives at a local assisted living. She is accompanied today by the transportation person from her facility. She is here for a skin rash and an open area on her left dorsal arm. The history behind this is vague apparently she has been scratching at this area and rubbing at for at least a month perhaps longer to than that. It was brought to the attention of Dr. Doy Hutching who is the [facility] and he gave her topical Bactroban as well as an oral antibiotic and apparently there has been some improvement although the area is still open. The facility is  supposed to be covering this was something to prevent the scratching although it is not clear that that is actually been taking place. I'm not exactly sure of the course of this area although the accompanying person seems to think it is some better. 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 I reviewed an extensive amount of lab work that came with her on her referral notes from 07/27/16 she had a normal comprehensive metabolic panel. Normal CBC and differential normal urinalysis TSH free T4 and free T3 all normal. Electronic Signature(s) Signed: 08/21/2016 5:12:34 PM By: Linton Ham MD Entered By: Linton Ham on 08/21/2016 11:31:29 Magloire, [name] ([id number]) -------------------------------------------------------------------------------- Physical Exam Details Patient Name: [name], [name]. Date of Service: 08/21/2016 9:30 AM Medical Record Patient Account Number: [id number] Number: Treating RN: Ahmed Prima Dec 21, 1940 (75 y.o. Other Clinician: Date of Birth/Sex: Female) Treating Alani Lacivita Primary Care Physician/Extender: Bo Merino Physician: Referring Physician: Betsey Holiday in Treatment: 0 Constitutional Sitting or standing Blood Pressure is within target range for patient.. Pulse regular and within target range for patient.Marland Kitchen Respirations regular, non-labored and within target range.. Temperature is normal and within the target range for the patient.. Patient's appearance is neat and clean. Appears in no acute distress. Well nourished and well developed.. Eyes Conjunctivae clear. No discharge.Marland Kitchen Respiratory Respiratory effort is easy and symmetric bilaterally. Rate is normal at rest and on room air.. Cardiovascular Heart rhythm and rate regular, without  murmur or gallop.. Nothing specifically of normal in either arm. Radial pulses are palpable. Lymphatic None palpable in the epitrochlear or axillary areas. Musculoskeletal . Integumentary (Hair, Skin) No widespread rashes were seen specifically on her hands. Psychiatric No evidence of depression, anxiety, or agitation.  Calm, cooperative, and communicative. Appropriate interactions and affect.. Notes Wound examination; the area in question is on the dorsal aspect of her left arm a perfectly circular wound. This as thick eczematous skin over most of it however there is an open area inferiorly. There does not appear to be any subcutaneous involvement careful palpation of the area does not reveal anything suspicious in the deep tissues Electronic Signature(s) Signed: 08/21/2016 5:12:34 PM By: Linton Ham MD Entered By: Linton Ham on 08/21/2016 11:33:40 Brunetto, [name] ([id number]) -------------------------------------------------------------------------------- Physician Orders Details Patient Name: [name]. Date of Service: 08/21/2016 9:30 AM Medical Record Patient Account Number: [id number] Number: Treating RN: Ahmed Prima 06-Nov-1940 (75 y.o. Other Clinician: Date of Birth/Sex: Female) Treating Candy Leverett Primary Care Physician/Extender: Bo Merino Physician: Referring Physician: Betsey Holiday in Treatment: 0 Verbal / Phone Orders: Yes Clinician: Carolyne Fiscal, Debi Read Back and Verified: Yes Diagnosis Coding Wound Cleansing Wound #1 Posterior Forearm o Clean wound with Normal Saline. o Cleanse wound with mild soap and water Anesthetic Wound #1 Posterior Forearm o Topical Lidocaine 4% cream applied to wound bed prior to debridement - for clinic use Primary Wound Dressing o Other: - ketoconazole ******used Nystatin in the office***** Secondary Dressing Wound #1 Posterior Forearm o Conform/Kerlix - tape, stretch  netting #4 o Non-adherent pad Dressing Change Frequency Wound #1 Posterior Forearm o Change dressing twice daily. - Morning and bedtime Follow-up Appointments Wound #1 Posterior Forearm o Return Appointment in 1 week. Additional Orders / Instructions Wound #1 Posterior Forearm o Increase protein intake. Medications-please add to medication list. Wound #1 Posterior Forearm o Other: - ketoconazole Laforest, Raziyah G. ([id number]) Patient Medications Allergies: NKDA Notifications Medication Indication Start End ketoconazole 08/21/2016 DOSE 1 - topical 2 % cream - 1 cream topical use 2x a day Electronic Signature(s) Signed: 08/21/2016 5:12:34 PM By: Linton Ham MD Signed: 08/22/2016 5:37:51 PM By: Alric Quan Entered By: Alric Quan on 08/21/2016 10:43:06 Andel, [name] ([id number]) -------------------------------------------------------------------------------- Prescription 08/21/2016 Patient Name: [name]. Physician: Ricard Dillon MD Date of Birth: [birth year] NPI#: [id number] Sex: F DEA#: [id number] Phone #: [phone] License #: [id number] Patient Address: [place] [address] [place], [place] [address] [facility] [address], [place], [place] [address] [phone] Allergies NKDA Medication Medication: Route: Strength: Form: ketoconazole 2 % topical topical 2% cream cream Class: TOPICAL ANTIFUNGALS Dose: Frequency / Time: Indication: 1 1 cream topical use 2x a day Number of Refills: Number of Units: 0 Generic Substitution: Start Date: End Date: One Time Use: Substitution Permitted [id number] No Note to Pharmacy: [facility]): Date(s): Electronic Signature(s) EMONE, ONDER ([id number]) Signed: 08/21/2016 5:12:34 PM By: Linton Ham MD Entered By: Linton Ham on 08/21/2016 11:36:29 Murata, [name]  ([id number]) --------------------------------------------------------------------------------  Problem List Details Patient Name: [name]. Date of Service: 08/21/2016 9:30 AM Medical Record Patient Account Number: [id number] Number: Treating RN: Ahmed Prima May 19, 1941 (75 y.o. Other Clinician: Date of Birth/Sex: Female) Treating Andreka Stucki Primary Care Physician/Extender: Bo Merino Physician: Referring Physician: Betsey Holiday in Treatment: 0 Active Problems ICD-10 Encounter Code Description Active Date Diagnosis B35.4 Tinea corporis 08/21/2016 Yes M33.10 Other dermatomyositis, organ involvement unspecified 08/21/2016 Yes Inactive Problems Resolved Problems Electronic Signature(s) Signed: 08/21/2016 5:12:34 PM By: Linton Ham MD Entered By: Linton Ham on 08/21/2016 11:26:10 Rentfrow, Jill Darnell Level ([id number]) -------------------------------------------------------------------------------- Progress Note Details Patient Name: [name]. Date of Service: 08/21/2016 9:30 AM Medical Record Patient Account Number: [id number]  [id number] Number: Treating RN: Ahmed Prima 08-May-1941 (75 y.o. Other Clinician: Date of Birth/Sex: Female) Treating Rosalyn Archambault Primary Care Physician/Extender: Bo Merino Physician: Referring Physician: Betsey Holiday in Treatment: 0 Subjective Chief Complaint Information obtained from Patient 08/21/16; patient is here for review of a skin rash on her left arm. History of Present Illness (HPI) 08/21/16; his is a very pleasant 75 year old woman who lives at a local assisted living. She is accompanied today by the transportation person from her facility. She is here for a skin rash and an open area on her left dorsal arm. The history behind this is vague apparently she has been scratching at this area and rubbing at for at least a month perhaps longer to than that. It was  brought to the attention of Dr. Doy Hutching who is the [facility] and he gave her topical Bactroban as well as an oral antibiotic and apparently there has been some improvement although the area is still open. The facility is supposed to be covering this was something to prevent the scratching although it is not clear that that is actually been taking place. I'm not exactly sure of the course of this area although the accompanying person seems to think it is some better. 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 I reviewed an extensive amount of lab work that came with her on her referral notes from 07/27/16 she had a normal comprehensive metabolic panel. Normal CBC and differential normal urinalysis TSH free T4 and free T3 all normal. Wound History Patient presents with 1 open wound that has been present for approximately 2 months. Patient has been treating wound in the following manner: ABT cream. Laboratory tests have not been performed in the last month. Patient reportedly has not tested positive for an antibiotic resistant organism. Patient reportedly has not tested positive for osteomyelitis. Patient reportedly has not had testing performed to evaluate circulation in the legs. Patient experiences the following problems associated with their wounds: infection. Patient History Information obtained from Patient. Allergies NKDA JOLYNN, CLOUATRE ([id number]) Family History Heart Disease - Mother, Father, No family history of Cancer, Diabetes, Hereditary Spherocytosis, Hypertension, Kidney Disease, Lung Disease, Seizures, Stroke, Thyroid Problems, Tuberculosis. Social History Never smoker, Marital Status - Divorced, Alcohol Use - Never, Drug Use - No History, Caffeine Use -  Daily. Medical History Hematologic/Lymphatic Patient has history of Anemia Cardiovascular Patient has history of Hypertension, Phlebitis - hx Neurologic Patient has history of Neuropathy Review of Systems (ROS) Constitutional Symptoms (General Health) The patient has no complaints or symptoms. Eyes The patient has no complaints or symptoms. Ear/Nose/Mouth/Throat The patient has no complaints or symptoms. Respiratory The patient has no complaints or symptoms. Cardiovascular hyperlipidemia varicosities of the leg Gastrointestinal hx colon polyps chronic constipation Endocrine Complains or has symptoms of Thyroid disease. Genitourinary hx UTIs Immunological hx herpes zoster poliomyelitis polio Integumentary (Skin) Complains or has symptoms of Wounds, dermatomyositis Musculoskeletal obesity Oncologic The patient has no complaints or symptoms. Psychiatric Complains or has symptoms of Anxiety, depression Lesueur, Norma G. ([id number]) Objective Constitutional Sitting or standing Blood Pressure is within target range for patient.. Pulse regular and within target range for patient.Marland Kitchen Respirations regular, non-labored and within target range.. Temperature is normal and within the target range for the patient.. Patient's appearance is neat and clean. Appears in  no acute distress. Well nourished and well developed.. Vitals Time Taken: 9:44 AM, Height: 62 in, Source: Stated, Weight: 167.1 lbs, Source: Measured, BMI: 30.6, Temperature: 97.8 F, Pulse: 69 bpm, Respiratory Rate: 16 breaths/min, Blood Pressure: 133/77 mmHg. Eyes Conjunctivae clear. No discharge.Marland Kitchen Respiratory Respiratory effort is easy and symmetric bilaterally. Rate is normal at rest and on room air.. Cardiovascular Heart rhythm and rate regular, without murmur or gallop.. Nothing specifically of normal in either arm. Radial pulses are palpable. Lymphatic None palpable in the epitrochlear or axillary  areas. Psychiatric No evidence of depression, anxiety, or agitation. Calm, cooperative, and communicative. Appropriate interactions and affect.. General Notes: Wound examination; the area in question is on the dorsal aspect of her left arm a perfectly circular wound. This as thick eczematous skin over most of it however there is an open area inferiorly. There does not appear to be any subcutaneous involvement careful palpation of the area does not reveal anything suspicious in the deep tissues Integumentary (Hair, Skin) No widespread rashes were seen specifically on her hands. Wound #1 status is Open. Original cause of wound was Gradually Appeared. The wound is located on the Posterior Forearm. The wound measures 1.5cm length x 3cm width x 0.1cm depth; 3.534cm^2 area and 0.353cm^3 volume. The wound is limited to skin breakdown. There is no tunneling or undermining noted. There is a medium amount of serous drainage noted. The wound margin is distinct with the outline attached to the wound base. There is small (1-33%) pink granulation within the wound bed. There is a large (67- 100%) amount of necrotic tissue within the wound bed including Eschar and Adherent Slough. The periwound skin appearance exhibited: Moist. Periwound temperature was noted as No Abnormality. The periwound has tenderness on palpation. EMILIA, PHOU ([id number]) Assessment Active Problems ICD-10 B35.4 - Tinea corporis M33.10 - Other dermatomyositis, organ involvement unspecified Plan Wound Cleansing: Wound #1 Posterior Forearm: Clean wound with Normal Saline. Cleanse wound with mild soap and water Anesthetic: Wound #1 Posterior Forearm: Topical Lidocaine 4% cream applied to wound bed prior to debridement - for clinic use Primary Wound Dressing: Other: - ketoconazole ******used Nystatin in the office***** Secondary Dressing: Wound #1 Posterior Forearm: Conform/Kerlix - tape, stretch netting #4 Non-adherent  pad Dressing Change Frequency: Wound #1 Posterior Forearm: Change dressing twice daily. - Morning and bedtime Follow-up Appointments: Wound #1 Posterior Forearm: Return Appointment in 1 week. Additional Orders / Instructions: Wound #1 Posterior Forearm: Increase protein intake. Medications-please add to medication list.: Wound #1 Posterior Forearm: Other: - ketoconazole The following medication(s) was prescribed: ketoconazole topical 2 % cream 1 1 cream topical use 2x a day starting 08/21/2016 Donoso, Patton G. ([id number]) #1 however this area started on her left arm and is obviously been influenced by repetitive scratching. The perfectly circular nature of this certainly brings the possibility of a tinea infection to the forefront. Although the patient has dermatomyositis this would certainly not be a textbook lesion associated with this although this does had an interesting wrinkled of the differential. I also wondered about a primary skin malignancy however for now I think the patient should receive aggressive topical antifungal therapy for perhaps 2-3 weeks or longer it will take to eradicate this even if this is the diagnosis. If this fails then I think she will need a shaved skin biopsy. Noteworthy that she already received topical Bactroban and I believe oral antibiotics although I'm not exactly sure which oral antibiotic was given. In any case I didn't think anything needed to  be cultured. There was no evidence of infection in the soft tissue around the area #2 rather than using over-the-counter antifungals I prescribed topical ketoconazole 2% twice a day to the area. I have asked the facility to keep this covered either with plain was or perhaps foam to keep her hands off this area. #3 I also noted a raised area on her dorsal right wrist although she seemed completely asymptomatic from this regard Electronic Signature(s) Signed: 08/21/2016 5:12:34 PM By: Linton Ham  MD Entered By: Linton Ham on 08/21/2016 11:37:29 Knecht, [name] ([id number]) -------------------------------------------------------------------------------- ROS/PFSH Details Patient Name: [name]. Date of Service: 08/21/2016 9:30 AM Medical Record Patient Account Number: [id number] Number: Treating RN: Ahmed Prima [birth year] (75 y.o. Other Clinician: Date of Birth/Sex: Female) Treating Quenesha Douglass Primary Care Physician/Extender: Bo Merino Physician: Referring Physician: Betsey Holiday in Treatment: 0 Information Obtained From Patient Wound History Do you currently have one or more open woundso Yes How many open wounds do you currently haveo 1 Approximately how long have you had your woundso 2 months How have you been treating your wound(s) until nowo ABT cream Has your wound(s) ever healed and then re-openedo No Have you had any lab work done in the past montho No Have you tested positive for an antibiotic resistant organism (MRSA, VRE)o No Have you tested positive for osteomyelitis (bone infection)o No Have you had any tests for circulation on your legso No Have you had other problems associated with your woundso Infection Endocrine Complaints and Symptoms: Positive for: Thyroid disease Integumentary (Skin) Complaints and Symptoms: Positive for: Wounds Review of System Notes: dermatomyositis Psychiatric Complaints and Symptoms: Positive for: Anxiety Review of System Notes: depression Constitutional Symptoms (General Health) Complaints and Symptoms: No Complaints or Symptoms Eyes Mungin, Icie G. ([id number]) Complaints and Symptoms: No Complaints or Symptoms Ear/Nose/Mouth/Throat Complaints and Symptoms: No Complaints or Symptoms Hematologic/Lymphatic Medical History: Positive for: Anemia Respiratory Complaints and Symptoms: No Complaints or Symptoms Cardiovascular Complaints and Symptoms: Review of  System Notes: hyperlipidemia varicosities of the leg Medical History: Positive for: Hypertension; Phlebitis - hx Gastrointestinal Complaints and Symptoms: Review of System Notes: hx colon polyps chronic constipation Genitourinary Complaints and Symptoms: Review of System Notes: hx UTIs Immunological Complaints and Symptoms: Review of System Notes: hx herpes zoster poliomyelitis polio Musculoskeletal Complaints and Symptoms: Review of System Notes: TYMARA, LUIS ([id number]) obesity Neurologic Medical History: Positive for: Neuropathy Oncologic Complaints and Symptoms: No Complaints or Symptoms Immunizations Pneumococcal Vaccine: Received Pneumococcal Vaccination: Yes Family and Social History Cancer: No; Diabetes: No; Heart Disease: Yes - Mother, Father; Hereditary Spherocytosis: No; Hypertension: No; Kidney Disease: No; Lung Disease: No; Seizures: No; Stroke: No; Thyroid Problems: No; Tuberculosis: No; Never smoker; Marital Status - Divorced; Alcohol Use: Never; Drug Use: No History; Caffeine Use: Daily; Financial Concerns: No; Food, Clothing or Shelter Needs: No; Support System Lacking: No; Transportation Concerns: No; Advanced Directives: No; Patient does not want information on Advanced Directives; Do not resuscitate: No; Living Will: No; Medical Power of Attorney: No Electronic Signature(s) Signed: 08/21/2016 5:12:34 PM By: Linton Ham MD Signed: 08/22/2016 5:37:51 PM By: Alric Quan Entered By: Alric Quan on 08/21/2016 09:58:47 Reza, Geana Darnell Level ([id number]) -------------------------------------------------------------------------------- [place] Details Patient Name: DAUSCH, [name]. Date of Service: 08/21/2016 Medical Record Patient Account Number: [id number] Number: Treating RN: Ahmed Prima 1941-10-09 (75 y.o. Other Clinician: Date of Birth/Sex: Female) Treating Sedra Morfin Primary Care Physician/Extender:  Bo Merino Physician: Suella Grove in Treatment: 0 Referring Physician: Fulton Reek Diagnosis Coding  ICD-10 Codes Code Description B35.4 Tinea corporis M33.10 Other dermatomyositis, organ involvement unspecified Facility Procedures CPT4 Code: [id number] Description: [id number] - WOUND CARE VISIT-LEV 4 EST PT Modifier: Quantity: 1 Physician Procedures CPT4 Code: [id number] Description: WC PHYS LEVEL 3 o NEW PT ICD-10 Description Diagnosis B35.4 Tinea corporis M33.10 Other dermatomyositis, organ involvement unsp Modifier: ecified Quantity: 1 Electronic Signature(s) Signed: 08/21/2016 5:12:34 PM By: Linton Ham MD Signed: 08/22/2016 5:37:51 PM By: Alric Quan Entered By: Alric Quan on 08/21/2016 15:55:40

## 2016-08-29 ENCOUNTER — Encounter: Payer: Medicare PPO | Attending: Internal Medicine | Admitting: Internal Medicine

## 2016-08-29 DIAGNOSIS — G629 Polyneuropathy, unspecified: Secondary | ICD-10-CM | POA: Insufficient documentation

## 2016-08-29 DIAGNOSIS — M3310 Other dermatopolymyositis, organ involvement unspecified: Secondary | ICD-10-CM | POA: Insufficient documentation

## 2016-08-29 DIAGNOSIS — I10 Essential (primary) hypertension: Secondary | ICD-10-CM | POA: Diagnosis not present

## 2016-08-29 DIAGNOSIS — B354 Tinea corporis: Secondary | ICD-10-CM | POA: Insufficient documentation

## 2016-08-30 NOTE — Progress Notes (Signed)
Jillian, [name] ([id number]) Visit Report for 08/29/2016 Arrival Information Details Patient Name: [name], [name]. Date of Service: 08/29/2016 2:15 PM Medical Record Patient Account Number: [id number] Number: Treating RN: Baruch Gouty, RN, BSN, Rita April 13, 1941 [phone] y.o. Other Clinician: Date of Birth/Sex: Female) Treating ROBSON, MICHAEL Primary Care Physician: Fulton Reek Physician/Extender: G Referring Physician: Betsey Holiday in Treatment: 1 Visit Information History Since Last Visit All ordered tests and consults were completed: No Patient Arrived: [name] Added or deleted any medications: No Arrival Time: 14:31 Any new allergies or adverse reactions: No Accompanied By: dtr Had a fall or experienced change in No Transfer Assistance: None activities of daily living that may affect Patient Identification Verified: Yes risk of falls: Secondary Verification Process Completed: Yes Signs or symptoms of abuse/neglect since last No Patient Requires Transmission-Based No visito Precautions: Hospitalized since last visit: No Patient Has Alerts: No Has Dressing in Place as Prescribed: Yes Pain Present Now: No Electronic Signature(s) Signed: 08/29/2016 5:51:44 PM By: Regan Lemming BSN, RN Entered By: Regan Lemming on 08/29/2016 14:32:26 Pouliot, Alyannah Darnell Level ([id number]) -------------------------------------------------------------------------------- Clinic Level of Care Assessment Details Patient Name: [name]. Date of Service: 08/29/2016 2:15 PM Medical Record Patient Account Number: [id number] Number: Treating RN: Baruch Gouty, RN, BSN, Rita October 26, 1941 [phone] y.o. Other Clinician: Date of Birth/Sex: Female) Treating ROBSON, [place] Primary Care Physician: Fulton Reek Physician/Extender: G Referring Physician: Betsey Holiday in Treatment: 1 Clinic Level of Care Assessment Items TOOL 4 Quantity Score []  - Use when only an EandM is performed on  FOLLOW-UP visit 0 ASSESSMENTS - Nursing Assessment / Reassessment X - Reassessment of Co-morbidities (includes updates in patient status) 1 10 X - Reassessment of Adherence to Treatment Plan 1 5 ASSESSMENTS - Wound and Skin Assessment / Reassessment X - Simple Wound Assessment / Reassessment - one wound 1 5 []  - Complex Wound Assessment / Reassessment - multiple wounds 0 []  - Dermatologic / Skin Assessment (not related to wound area) 0 ASSESSMENTS - Focused Assessment []  - Circumferential Edema Measurements - multi extremities 0 []  - Nutritional Assessment / Counseling / Intervention 0 []  - Lower Extremity Assessment (monofilament, tuning fork, pulses) 0 []  - Peripheral Arterial Disease Assessment (using hand held doppler) 0 ASSESSMENTS - Ostomy and/or Continence Assessment and Care []  - Incontinence Assessment and Management 0 []  - Ostomy Care Assessment and Management (repouching, etc.) 0 PROCESS - Coordination of Care X - Simple Patient / Family Education for ongoing care 1 15 []  - Complex (extensive) Patient / Family Education for ongoing care 0 []  - Staff obtains Programmer, systems, Records, Test Results / Process Orders 0 []  - Staff telephones HHA, Nursing Homes / Clarify orders / etc 0 Cappelletti, Lynniah G. ([id number]) []  - Routine Transfer to another Facility (non-emergent condition) 0 []  - Routine Hospital Admission (non-emergent condition) 0 []  - New Admissions / Biomedical engineer / Ordering NPWT, Apligraf, etc. 0 []  - Emergency Hospital Admission (emergent condition) 0 []  - Simple Discharge Coordination 0 []  - Complex (extensive) Discharge Coordination 0 PROCESS - Special Needs []  - Pediatric / Minor Patient Management 0 []  - Isolation Patient Management 0 []  - Hearing / Language / Visual special needs 0 []  - Assessment of Community assistance (transportation, D/C planning, etc.) 0 []  - Additional assistance / Altered mentation 0 []  - Support Surface(s) Assessment (bed,  cushion, seat, etc.) 0 INTERVENTIONS - Wound Cleansing / Measurement X - Simple Wound Cleansing - one wound 1 5 []  - Complex Wound Cleansing - multiple wounds 0  X - Wound Imaging (photographs - any number of wounds) 1 5 []  - Wound Tracing (instead of photographs) 0 X - Simple Wound Measurement - one wound 1 5 []  - Complex Wound Measurement - multiple wounds 0 INTERVENTIONS - Wound Dressings X - Small Wound Dressing one or multiple wounds 1 10 []  - Medium Wound Dressing one or multiple wounds 0 []  - Large Wound Dressing one or multiple wounds 0 []  - Application of Medications - topical 0 []  - Application of Medications - injection 0 Sabater, Delilah G. ([id number]) INTERVENTIONS - Miscellaneous []  - External ear exam 0 []  - Specimen Collection (cultures, biopsies, blood, body fluids, etc.) 0 []  - Specimen(s) / Culture(s) sent or taken to Lab for analysis 0 []  - Patient Transfer (multiple staff / Harrel Lemon Lift / Similar devices) 0 []  - Simple Staple / Suture removal (25 or less) 0 []  - Complex Staple / Suture removal (26 or more) 0 []  - Hypo / Hyperglycemic Management (close monitor of Blood Glucose) 0 []  - Ankle / Brachial Index (ABI) - do not check if billed separately 0 X - Vital Signs 1 5 Has the patient been seen at the hospital within the last three years: Yes Total Score: 65 Level Of Care: New/Established - Level 2 Electronic Signature(s) Signed: 08/29/2016 5:51:44 PM By: Regan Lemming BSN, RN Entered By: Regan Lemming on 08/29/2016 15:11:19 Mcgowan, Chasitee Darnell Level ([id number]) -------------------------------------------------------------------------------- Encounter Discharge Information Details Patient Name: [name] Date of Service: 08/29/2016 2:15 PM Medical Record Patient Account Number: [id number] Number: Treating RN: Baruch Gouty, RN, BSN, Rita 1941/01/23 [phone] y.o. Other Clinician: Date of Birth/Sex: Female) Treating ROBSON, MICHAEL Primary Care Physician: Fulton Reek Physician/Extender: G Referring Physician: Betsey Holiday in Treatment: 1 Encounter Discharge Information Items Discharge Pain Level: 0 Discharge Condition: Stable Ambulatory Status: Walker Discharge Destination: Nursing Home Transportation: Private Auto Accompanied By: caregiver Schedule Follow-up Appointment: No Medication Reconciliation completed No and provided to Patient/Care Kortne All: Provided on Clinical Summary of Care: 08/29/2016 Form Type Recipient Paper Patient CD Electronic Signature(s) Signed: 08/29/2016 3:19:17 PM By: Ruthine Dose Entered By: Ruthine Dose on 08/29/2016 15:19:17 Arzuaga, [name] ([id number]) -------------------------------------------------------------------------------- Lower Extremity Assessment Details Patient Name: Yi, [name]. Date of Service: 08/29/2016 2:15 PM Medical Record Patient Account Number: [id number] Number: Treating RN: Baruch Gouty, RN, BSN, Rita 1941/07/10 [phone] y.o. Other Clinician: Date of Birth/Sex: Female) Treating ROBSON, MICHAEL Primary Care Physician: Fulton Reek Physician/Extender: G Referring Physician: Betsey Holiday in Treatment: 1 Electronic Signature(s) Signed: 08/29/2016 5:51:44 PM By: Regan Lemming BSN, RN Entered By: Regan Lemming on 08/29/2016 14:35:26 Blasius, Mattalynn Darnell Level ([id number]) -------------------------------------------------------------------------------- Multi Wound Chart Details Patient Name: Marsiglia, [name]. Date of Service: 08/29/2016 2:15 PM Medical Record Patient Account Number: [id number] Number: Treating RN: Baruch Gouty, RN, BSN, Rita 10-23-1941 [phone] y.o. Other Clinician: Date of Birth/Sex: Female) Treating ROBSON, [place] Primary Care Physician: Fulton Reek Physician/Extender: G Referring Physician: Betsey Holiday in Treatment: 1 Vital Signs Height(in): 62 Pulse(bpm): 70 Weight(lbs): 167.1 Blood Pressure 136/75 (mmHg): Body Mass  Index(BMI): 31 Temperature(F): 98.3 Respiratory Rate 16 (breaths/min): Photos: [1:No Photos] [N/A:N/A] Wound Location: [1:Forearm - Posterior] [N/A:N/A] Wounding Event: [1:Gradually Appeared] [N/A:N/A] Primary Etiology: [1:To be determined] [N/A:N/A] Comorbid History: [1:Anemia, Hypertension, Phlebitis, Neuropathy] [N/A:N/A] Date Acquired: [1:06/21/2016] [N/A:N/A] Weeks of Treatment: [1:1] [N/A:N/A] Wound Status: [1:Open] [N/A:N/A] Measurements L x W x D 1.1x3x0.1 [N/A:N/A] (cm) Area (cm) : [1:2.592] [N/A:N/A] Volume (cm) : [1:0.259] [N/A:N/A] % Reduction in Area: [1:26.70%] [N/A:N/A] % Reduction in Volume: 26.60% [N/A:N/A]  Classification: [1:Partial Thickness] [N/A:N/A] Exudate Amount: [1:Small] [N/A:N/A] Exudate Type: [1:Serous] [N/A:N/A] Exudate Color: [1:amber] [N/A:N/A] Wound Margin: [1:Distinct, outline attached] [N/A:N/A] Granulation Amount: [1:Large (67-100%)] [N/A:N/A] Granulation Quality: [1:Pink] [N/A:N/A] Necrotic Amount: [1:Small (1-33%)] [N/A:N/A] Necrotic Tissue: [1:Eschar, Adherent Slough] [N/A:N/A] Exposed Structures: [1:Fascia: No Fat: No Tendon: No Muscle: No] [N/A:N/A] Joint: No Bone: No Limited to Skin Breakdown Epithelialization: Medium (34-66%) N/A N/A Periwound Skin Texture: Edema: No N/A N/A Excoriation: No Induration: No Callus: No Crepitus: No Fluctuance: No Friable: No Rash: No Scarring: No Periwound Skin Moist: Yes N/A N/A Moisture: Maceration: No Dry/Scaly: No Periwound Skin Color: Atrophie Blanche: No N/A N/A Cyanosis: No Ecchymosis: No Erythema: No Hemosiderin Staining: No Mottled: No Pallor: No Rubor: No Temperature: No Abnormality N/A N/A Tenderness on Yes N/A N/A Palpation: Wound Preparation: Ulcer Cleansing: N/A N/A Rinsed/Irrigated with Saline Topical Anesthetic Applied: Other: lidocaine 4% Treatment Notes Electronic Signature(s) Signed: 08/29/2016 3:06:34 PM By: Regan Lemming BSN, RN Entered By: Regan Lemming  on 08/29/2016 15:06:34 Readlyn, [name] ([id number]) -------------------------------------------------------------------------------- [place] Details Patient Name: ANYSHA, NORDNESS Date of Service: 08/29/2016 2:15 PM Medical Record Patient Account Number: [id number] Number: Treating RN: Baruch Gouty, RN, BSN, Rita 20-Dec-1940 [phone] y.o. Other Clinician: Date of Birth/Sex: Female) Treating ROBSON, MICHAEL Primary Care Physician: Fulton Reek Physician/Extender: G Referring Physician: Betsey Holiday in Treatment: 1 Active Inactive Abuse / Safety / Falls / Self Care Management Nursing Diagnoses: Potential for falls Goals: Patient will remain injury free Date Initiated: 08/21/2016 Goal Status: Active Interventions: Assess fall risk on admission and as needed Assess impairment of mobility on admission and as needed per policy Notes: Nutrition Nursing Diagnoses: Imbalanced nutrition Goals: Patient/caregiver agrees to and verbalizes understanding of need to use nutritional supplements and/or vitamins as prescribed Date Initiated: 08/21/2016 Goal Status: Active Interventions: Assess patient nutrition upon admission and as needed per policy Notes: Orientation to the Wound Care Program Nursing Diagnoses: Knowledge deficit related to the wound healing center program SALMA, RUS ([id number]) Goals: Patient/caregiver will verbalize understanding of the [place] Date Initiated: 08/21/2016 Goal Status: Active Interventions: Provide education on orientation to the wound center Notes: Wound/Skin Impairment Nursing Diagnoses: Impaired tissue integrity Goals: Ulcer/skin breakdown will have a volume reduction of 30% by week 4 Date Initiated: 08/21/2016 Goal Status: Active Ulcer/skin breakdown will have a volume reduction of 50% by week 8 Date Initiated: 08/21/2016 Goal Status: Active Ulcer/skin breakdown will have a  volume reduction of 80% by week 12 Date Initiated: 08/21/2016 Goal Status: Active Interventions: Assess patient/caregiver ability to perform ulcer/skin care regimen upon admission and as needed Assess ulceration(s) every visit Notes: Electronic Signature(s) Signed: 08/29/2016 5:51:44 PM By: Regan Lemming BSN, RN Previous Signature: 08/29/2016 3:06:26 PM Version By: Regan Lemming BSN, RN Entered By: Regan Lemming on 08/29/2016 15:10:28 Hentges, Dagny Darnell Level ([id number]) -------------------------------------------------------------------------------- Pain Assessment Details Patient Name: [name]. Date of Service: 08/29/2016 2:15 PM Medical Record Patient Account Number: [id number] Number: Treating RN: Baruch Gouty, RN, BSN, Rita [birth year] [phone] y.o. Other Clinician: Date of Birth/Sex: Female) Treating ROBSON, MICHAEL Primary Care Physician: Fulton Reek Physician/Extender: G Referring Physician: Betsey Holiday in Treatment: 1 Active Problems Location of Pain Severity and Description of Pain Patient Has Paino No Site Locations With Dressing Change: No Pain Management and Medication Current Pain Management: Electronic Signature(s) Signed: 08/29/2016 5:51:44 PM By: Regan Lemming BSN, RN Entered By: Regan Lemming on 08/29/2016 14:32:37 Gisler, [name] ([id number]) -------------------------------------------------------------------------------- Patient/Caregiver Education Details Patient Name: [name] Date of Service:  08/29/2016 2:15 PM Medical Record Patient Account Number: [id number] Number: Treating RN: Baruch Gouty, RN, BSN, Velva Harman 10-22-1941 [phone] y.o. Other Clinician: Date of Birth/Gender: Female) Treating ROBSON, MICHAEL Primary Care Physician: Fulton Reek Physician/Extender: G Referring Physician: Betsey Holiday in Treatment: 1 Education Assessment Education Provided To: Patient Education Topics Provided Welcome To [facility]: Methods: Explain/Verbal Responses: State content correctly Wound/Skin Impairment: Methods: Explain/Verbal Responses: State content correctly Electronic Signature(s) Signed: 08/29/2016 5:51:44 PM By: Regan Lemming BSN, RN Entered By: Regan Lemming on 08/29/2016 15:12:31 Peabody, Edessa Darnell Level ([id number]) -------------------------------------------------------------------------------- Wound Assessment Details Patient Name: Eynon, [name]. Date of Service: 08/29/2016 2:15 PM Medical Record Patient Account Number: [id number] Number: Treating RN: Baruch Gouty, RN, BSN, Rita January 19, 1941 [phone] y.o. Other Clinician: Date of Birth/Sex: Female) Treating ROBSON, MICHAEL Primary Care Physician: Fulton Reek Physician/Extender: G Referring Physician: Betsey Holiday in Treatment: 1 Wound Status Wound Number: 1 Primary To be determined Etiology: Wound Location: Forearm - Posterior Wound Status: Open Wounding Event: Gradually Appeared Comorbid Anemia, Hypertension, Phlebitis, Date Acquired: 06/21/2016 History: Neuropathy Weeks Of Treatment: 1 Clustered Wound: No Photos Photo Uploaded By: Regan Lemming on 08/29/2016 17:42:20 Wound Measurements Length: (cm) 1.1 Width: (cm) 3 Depth: (cm) 0.1 Area: (cm) 2.592 Volume: (cm) 0.259 % Reduction in Area: 26.7% % Reduction in Volume: 26.6% Epithelialization: Medium (34-66%) Tunneling: No Undermining: No Wound Description Classification: Partial Thickness Wound Margin: Distinct, outline attached Exudate Amount: Small Exudate Type: Serous Exudate Color: amber Foul Odor After Cleansing: No Wound Bed Granulation Amount: Large (67-100%) Exposed Structure Granulation Quality: Pink Fascia Exposed: No Necrotic Amount: Small (1-33%) Fat Layer Exposed: No Gilpatrick, Dion G. ([id number]) Necrotic Quality: Eschar, Adherent Slough Tendon Exposed: No Muscle Exposed: No Joint Exposed: No Bone Exposed: No Limited to Skin  Breakdown Periwound Skin Texture Texture Color No Abnormalities Noted: No No Abnormalities Noted: No Callus: No Atrophie Blanche: No Crepitus: No Cyanosis: No Excoriation: No Ecchymosis: No Fluctuance: No Erythema: No Friable: No Hemosiderin Staining: No Induration: No Mottled: No Localized Edema: No Pallor: No Rash: No Rubor: No Scarring: No Temperature / Pain Moisture Temperature: No Abnormality No Abnormalities Noted: No Tenderness on Palpation: Yes Dry / Scaly: No Maceration: No Moist: Yes Wound Preparation Ulcer Cleansing: Rinsed/Irrigated with Saline Topical Anesthetic Applied: Other: lidocaine 4%, Treatment Notes Wound #1 (Posterior Forearm) 1. Cleansed with: Cleanse wound with antibacterial soap and water 4. Dressing Applied: Other dressing (specify in notes) 5. Secondary Dressing Applied Kerlix/Conform 7. Secured with Tape Notes nystatin cream, netting Electronic Signature(s) Signed: 08/29/2016 5:51:44 PM By: Regan Lemming BSN, RN Entered By: Regan Lemming on 08/29/2016 14:37:58 Hamm, [name] ([id number]) -------------------------------------------------------------------------------- [place] Details Patient Name: [name] Date of Service: 08/29/2016 2:15 PM Medical Record Patient Account Number: [id number] Number: Treating RN: Baruch Gouty, RN, BSN, Rita [birth year] [phone] y.o. Other Clinician: Date of Birth/Sex: Female) Treating ROBSON, MICHAEL Primary Care Physician: Fulton Reek Physician/Extender: G Referring Physician: Betsey Holiday in Treatment: 1 Vital Signs Time Taken: 14:33 Temperature (F): 98.3 Height (in): 62 Pulse (bpm): 70 Weight (lbs): 167.1 Respiratory Rate (breaths/min): 16 Body Mass Index (BMI): 30.6 Blood Pressure (mmHg): 136/75 Reference Range: 80 - 120 mg / dl Electronic Signature(s) Signed: 08/29/2016 5:51:44 PM By: Regan Lemming BSN, RN Entered By: Regan Lemming on 08/29/2016 14:35:19

## 2016-08-30 NOTE — Progress Notes (Signed)
JETAIME, MATHIEU ([id number]) Visit Report for 08/29/2016 Chief Complaint Document Details Patient Name: [name], [name]. Date of Service: 08/29/2016 2:15 PM Medical Record Patient Account Number: [id number] Number: Treating RN: Baruch Gouty, RN, BSN, Rita August 05, 1941 [phone] y.o. Other Clinician: Date of Birth/Sex: Female) Treating Egidio Lofgren Primary Care Physician/Extender: Bo Merino Physician: Referring Physician: Betsey Holiday in Treatment: 1 Information Obtained from: Patient Chief Complaint 08/21/16; patient is here for review of a skin rash on her left arm. Electronic Signature(s) Signed: 08/29/2016 5:04:57 PM By: Linton Ham MD Entered By: Linton Ham on 08/29/2016 16:52:00 Fleites, [name] ([id number]) -------------------------------------------------------------------------------- HPI Details Patient Name: Jillian, [name] Date of Service: 08/29/2016 2:15 PM Medical Record Patient Account Number: [id number] Number: Treating RN: Baruch Gouty, RN, BSN, Rita 1940/11/11 [phone] y.o. Other Clinician: Date of Birth/Sex: Female) Treating Siddharth Babington Primary Care Physician/Extender: Bo Merino Physician: Referring Physician: Betsey Holiday in Treatment: 1 History of Present Illness HPI Description: 08/21/16; his is a very pleasant 75 year old woman who lives at a local assisted living. She is accompanied today by the transportation person from her facility. She is here for a skin rash and an open area on her left dorsal arm. The history behind this is vague apparently she has been scratching at this area and rubbing at for at least a month perhaps longer to than that. It was brought to the attention of Dr. Doy Hutching who is the patient's primary [facility] and he gave her topical Bactroban as well as an oral antibiotic and apparently there has been some improvement although the area is still open. The facility is  supposed to be covering this was something to prevent the scratching although it is not clear that that is actually been taking place. I'm not exactly sure of the course of this area although the accompanying person seems to think it is some better. 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 I reviewed an extensive amount of lab work that came with her on her referral notes from 07/27/16 she had a normal comprehensive metabolic panel. Normal CBC and differential normal urinalysis TSH free T4 and free T3 all normal. 08/29/16; the arm is perhaps somewhat better although only a little bit. Not as much change as I might of like to seen if my initial thought about tinea corpus was correct Electronic Signature(s) Signed: 08/29/2016 5:04:57 PM By: Linton Ham MD Entered By: Linton Ham on 08/29/2016 16:53:27 Hott, [name] ([id number]) -------------------------------------------------------------------------------- Physical Exam Details Patient Name: [name], [name]. Date of Service: 08/29/2016 2:15 PM Medical Record Patient Account Number: [id number] Number: Treating RN: Baruch Gouty, RN, BSN, Rita [birth year] [phone] y.o. Other Clinician: Date of Birth/Sex: Female) Treating Bellanie Matthew Primary Care Physician/Extender: Bo Merino Physician: Referring Physician: Betsey Holiday in Treatment: 1 Constitutional Sitting or standing Blood Pressure is within target range for patient.. Pulse regular and within target range for patient.Marland Kitchen Respirations regular, non-labored and within target range.. Temperature is normal and within the target range for the patient.. Patient's appearance is neat and clean. Appears in no acute distress. Well nourished and well developed.. Eyes Conjunctivae  clear. No discharge.. Neck Neck supple and symmetrical. No masses or crepitus. Respiratory Respiratory effort is easy and symmetric bilaterally. Rate is normal at rest and on room air.. Cardiovascular Radial pulses are  normal. Lymphatic Nonpalpable in the epitrochlear or axillary areas. Notes Wound exam; the area questions on the dorsal aspect of her left arm a perfectly circular area with some open wound in its inferior aspect. The thick eczematous skin from last week appears to be resolved. There is no subcutaneous palpable involvement of the subcutaneous tissue. There is no tenderness Electronic Signature(s) Signed: 08/29/2016 5:04:57 PM By: Linton Ham MD Entered By: Linton Ham on 08/29/2016 16:54:59 [name], [name] ([id number]) -------------------------------------------------------------------------------- Physician Orders Details Patient Name: [name] Date of Service: 08/29/2016 2:15 PM Medical Record Patient Account Number: [id number] Number: Treating RN: Baruch Gouty, RN, BSN, Rita [birth year] [phone] y.o. Other Clinician: Date of Birth/Sex: Female) Treating Zackry Deines Primary Care Physician/Extender: Bo Merino Physician: Referring Physician: Betsey Holiday in Treatment: 1 Verbal / Phone Orders: Yes Clinician: Afful, RN, BSN, Rita Read Back and Verified: Yes Diagnosis Coding Wound Cleansing Wound #1 Posterior Forearm o Clean wound with Normal Saline. o Cleanse wound with mild soap and water Anesthetic Wound #1 Posterior Forearm o Topical Lidocaine 4% cream applied to wound bed prior to debridement - for clinic use Primary Wound Dressing o Other: - ketoconazole ******used Nystatin in the office***** Secondary Dressing Wound #1 Posterior Forearm o Conform/Kerlix - tape, stretch netting #4 o Non-adherent pad Dressing Change Frequency Wound #1 Posterior Forearm o Change dressing twice daily. - Morning and  bedtime Follow-up Appointments Wound #1 Posterior Forearm o Return Appointment in 1 week. Additional Orders / Instructions Wound #1 Posterior Forearm o Increase protein intake. Medications-please add to medication list. Wound #1 Posterior Forearm o Other: - ketoconazole VENISA, ESTUPINAN ([id number]) Electronic Signature(s) Signed: 08/29/2016 5:04:57 PM By: Linton Ham MD Signed: 08/29/2016 5:51:44 PM By: Regan Lemming BSN, RN Entered By: Regan Lemming on 08/29/2016 15:10:47 Glauber, Padme Darnell Level ([id number]) -------------------------------------------------------------------------------- Problem List Details Patient Name: Bureau, [name]. Date of Service: 08/29/2016 2:15 PM Medical Record Patient Account Number: [id number] Number: Treating RN: Baruch Gouty, RN, BSN, Rita [birth year] [phone] y.o. 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 ([id number]) -------------------------------------------------------------------------------- Progress Note Details Patient Name: [name]. Date of Service: 08/29/2016 2:15 PM Medical Record Patient Account Number: [id number] Number: Treating RN: Baruch Gouty, RN, BSN, Rita Sep 28, 1941 [phone] y.o. Other Clinician: Date of Birth/Sex: Female) Treating Taje Tondreau Primary Care Physician/Extender: Bo Merino Physician: Referring Physician: Betsey Holiday in Treatment: 1 Subjective Chief  Complaint Information obtained from Patient 08/21/16; patient is here for review of a skin rash on her left arm. History of Present Illness (HPI) 08/21/16; his is a very pleasant 75 year old woman who lives at a local assisted living. She is accompanied today by the transportation person from her facility. She is here for a skin rash and an open area on her left dorsal arm. The history behind this is vague apparently she has been scratching at this area and rubbing at for at least a month perhaps longer to than that. It was brought to the attention of Dr. Doy Hutching who is the patient's primary [facility] and he gave her topical Bactroban as well as an oral antibiotic and apparently there has  been some improvement although the area is still open. The facility is supposed to be covering this was something to prevent the scratching although it is not clear that that is actually been taking place. I'm not exactly sure of the course of this area although the accompanying person seems to think it is some better. 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 I reviewed an extensive amount of lab work that came with her on her referral notes from 07/27/16 she had a normal comprehensive metabolic panel. Normal CBC and differential normal urinalysis TSH free T4 and free T3 all normal. 08/29/16; the arm is perhaps somewhat better although only a little bit. Not as much change as I might of like to seen if my initial thought about tinea corpus was correct Objective Constitutional Sitting or standing Blood Pressure is within target range for patient.. Pulse regular and within target range for patient.Marland Kitchen Respirations regular, non-labored and within target range.. Temperature is normal and within the target  range for the patient.. Patient's appearance is neat and clean. Appears in no acute distress. Well nourished and well developed.Marland Kitchen CHRISTYANN, SHAKE ([id number]) Vitals Time Taken: 2:33 PM, Height: 62 in, Weight: 167.1 lbs, BMI: 30.6, Temperature: 98.3 F, Pulse: 70 bpm, Respiratory Rate: 16 breaths/min, Blood Pressure: 136/75 mmHg. Eyes Conjunctivae clear. No discharge.. Neck Neck supple and symmetrical. No masses or crepitus. Respiratory Respiratory effort is easy and symmetric bilaterally. Rate is normal at rest and on room air.. Cardiovascular Radial pulses are normal. Lymphatic Nonpalpable in the epitrochlear or axillary areas. General Notes: Wound exam; the area questions on the dorsal aspect of her left arm a perfectly circular area with some open wound in its inferior aspect. The thick eczematous skin from last week appears to be resolved. There is no subcutaneous palpable involvement of the subcutaneous tissue. There is no tenderness Integumentary (Hair, Skin) Wound #1 status is Open. Original cause of wound was Gradually Appeared. The wound is located on the Posterior Forearm. The wound measures 1.1cm length x 3cm width x 0.1cm depth; 2.592cm^2 area and 0.259cm^3 volume. The wound is limited to skin breakdown. There is no tunneling or undermining noted. There is a small amount of serous drainage noted. The wound margin is distinct with the outline attached to the wound base. There is large (67-100%) pink granulation within the wound bed. There is a small (1-33%) amount of necrotic tissue within the wound bed including Eschar and Adherent Slough. The periwound skin appearance exhibited: Moist. The periwound skin appearance did not exhibit: Callus, Crepitus, Excoriation, Fluctuance, Friable, Induration, Localized Edema, Rash, Scarring, Dry/Scaly, Maceration, Atrophie Blanche, Cyanosis, Ecchymosis, Hemosiderin Staining, Mottled, Pallor, Rubor, Erythema. Periwound temperature was  noted as No Abnormality. The periwound has tenderness on palpation. Assessment Active Problems ICD-10 B35.4 - Tinea corporis M33.10 - Other dermatomyositis, organ involvement unspecified Blaze, Panayiota G. ([id number]) Plan Wound Cleansing: Wound #1 Posterior Forearm: Clean wound with Normal Saline. Cleanse wound with mild soap and water Anesthetic: Wound #1 Posterior Forearm: Topical Lidocaine 4% cream applied to wound bed prior to debridement - for clinic use Primary Wound Dressing: Other: - ketoconazole ******used Nystatin in the office***** Secondary Dressing: Wound #1 Posterior Forearm: Conform/Kerlix - tape, stretch netting #4 Non-adherent pad Dressing Change Frequency: Wound #1 Posterior Forearm: Change dressing twice daily. - Morning and bedtime Follow-up Appointments: Wound #1  Posterior Forearm: Return Appointment in 1 week. Additional Orders / Instructions: Wound #1 Posterior Forearm: Increase protein intake. Medications-please add to medication list.: Wound #1 Posterior Forearm: Other: - ketoconazole #1 I am still not completely certain what this this although I think we should continue the ketoconazole being changed twice a day for now. I may add silver alginate next week, silver being antifungal as well. Electronic Signature(s) Signed: 08/29/2016 5:04:57 PM By: Linton Ham MD Entered By: Linton Ham on 08/29/2016 16:55:31 [place], [name] ([id number]) -------------------------------------------------------------------------------- [place] Details Patient Name: [name] Date of Service: 08/29/2016 Medical Record Patient Account Number: [id number] Number: Treating RN: Baruch Gouty, RN, BSN, Rita [birth year] [phone] y.o. Other Clinician: Date of Birth/Sex: Female) Treating Charolett Yarrow Primary Care Physician/Extender: Bo Merino Physician: Suella Grove in Treatment: 1 Referring Physician: Fulton Reek Diagnosis Coding ICD-10  Codes Code Description B35.4 Tinea corporis M33.10 Other dermatomyositis, organ involvement unspecified Facility Procedures CPT4 Code: [id number] Description: [phone] - WOUND CARE VISIT-LEV 2 EST PT Modifier: Quantity: 1 Physician Procedures CPT4 Code: [id number] Description: [id number] - WC PHYS LEVEL 3 - EST PT ICD-10 Description Diagnosis B35.4 Tinea corporis Modifier: Quantity: 1 Electronic Signature(s) Signed: 08/29/2016 5:04:57 PM By: Linton Ham MD Entered By: Linton Ham on 08/29/2016 16:56:06

## 2016-09-05 ENCOUNTER — Encounter: Payer: Medicare PPO | Admitting: Nurse Practitioner

## 2016-09-05 DIAGNOSIS — M3310 Other dermatopolymyositis, organ involvement unspecified: Secondary | ICD-10-CM | POA: Diagnosis not present

## 2016-09-06 NOTE — Progress Notes (Signed)
[name], [name] ([id number]) Visit Report for 09/05/2016 Chief Complaint Document Details Patient Name: ADLEAN, NEWHARD. Date of Service: 09/05/2016 10:45 AM Medical Record Number: [id number] Patient Account Number: [id number] Date of Birth/Sex: 1940-11-08 (75 y.o. Female) Treating RN: Montey Hora Primary Care Physician: Fulton Reek Other Clinician: Referring Physician: Fulton Reek Treating Physician/Extender: Cathie Olden in Treatment: 2 Information Obtained from: Patient Chief Complaint patient presents for evaluation od wound to left arm. Electronic Signature(s) Signed: 09/05/2016 3:12:16 PM By: Lawanda Cousins Entered By: Lawanda Cousins on 09/05/2016 15:12:16 Manship, [name] ([id number]) -------------------------------------------------------------------------------- HPI Details Patient Name: [name] Date of Service: 09/05/2016 10:45 AM Medical Record Number: [id number] Patient Account Number: [id number] Date of Birth/Sex: 1941-06-17 (75 y.o. Female) Treating RN: Montey Hora Primary Care Physician: Fulton Reek Other Clinician: Referring Physician: Fulton Reek Treating Physician/Extender: Cathie Olden in Treatment: 2 History of Present Illness HPI Description: 08/21/16; his is a very pleasant 75 year old woman who lives at a local assisted living. She is accompanied today by the transportation person from her facility. She is here for a skin rash and an open area on her left dorsal arm. The history behind this is vague apparently she has been scratching at this area and rubbing at for at least a month perhaps longer to than that. It was brought to the attention of Dr. Doy Hutching who is the [facility]'s [facility] and he gave her topical Bactroban as well as an oral antibiotic and apparently there has been some improvement although the area is still open. The facility is supposed to be covering this was something to  prevent the scratching although it is not clear that that is actually been taking place. I'm not exactly sure of the course of this area although the accompanying person seems to think it is some better. 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 I reviewed an extensive amount of lab work that came with her on her referral notes from 07/27/16 she had a normal comprehensive metabolic panel. Normal CBC and differential normal urinalysis TSH free T4 and free T3 all normal. 08/29/16; the arm is perhaps somewhat better although only a little bit. Not as much change as I might of like to seen if my initial thought about tinea corpus was correct 09/05/2016 - overall area to the LFA appears improved with one primary ulcer and no observable satellite lesions or areas of drainage. She denies any irching and/or scratching to LFA Electronic Signature(s) Signed: 09/05/2016 3:14:12 PM By: Lawanda Cousins Entered By: Lawanda Cousins on 09/05/2016 15:14:12 [name], [name] ([id number]) -------------------------------------------------------------------------------- Physical Exam Details Patient Name: [name], Jillian G. Date of Service: 09/05/2016 10:45 AM Medical Record Number: [id number] Patient Account Number: [id number] Date of Birth/Sex: 1940/11/30 (75 y.o. Female) Treating RN: Montey Hora Primary Care Physician: Fulton Reek Other Clinician: Referring Physician: Fulton Reek Treating Physician/Extender: Lawanda Cousins Weeks in Treatment: 2 Constitutional BP within normal limits. afebrile. well nourished; well developed; appears stated age;Marland Kitchen Respiratory non-labored respiratory effort. Psychiatric calm, pleasant. Notes Integumentary - primary ulcer to LFA with no peri-ulcer satellite lesions, erythema, or  induration; no evidence of scratching Electronic Signature(s) Signed: 09/05/2016 3:15:51 PM By: Lawanda Cousins Entered By: Lawanda Cousins on 09/05/2016 15:15:51 [name], [name] ([id number]) -------------------------------------------------------------------------------- Physician Orders Details Patient Name: [name]. Date of Service: 09/05/2016 10:45 AM Medical  Record Number: [id number] Patient Account Number: [id number] Date of Birth/Sex: [birth year] (75 y.o. Female) Treating RN: Montey Hora Primary Care Physician: Fulton Reek Other Clinician: Referring Physician: Fulton Reek Treating Physician/Extender: Cathie Olden in Treatment: 2 Verbal / Phone Orders: Yes Clinician: Montey Hora Read Back and Verified: Yes Diagnosis Coding Wound Cleansing Wound #1 Left,Posterior Forearm o Clean wound with Normal Saline. o Cleanse wound with mild soap and water Anesthetic Wound #1 Left,Posterior Forearm o Topical Lidocaine 4% cream applied to wound bed prior to debridement - for clinic use Primary Wound Dressing o Prisma Ag - given to patient Secondary Dressing Wound #1 Left,Posterior Forearm o Boardered Foam Dressing o Non-adherent pad Dressing Change Frequency Wound #1 Left,Posterior Forearm o Other: - change on Saturday Follow-up Appointments Wound #1 Left,Posterior Forearm o Return Appointment in 1 week. Additional Orders / Instructions Wound #1 Left,Posterior Forearm o Increase protein intake. Electronic Signature(s) Signed: 09/05/2016 5:47:50 PM By: Montey Hora Signed: 09/06/2016 2:25:42 AM By: Lawanda Cousins Entered By: Montey Hora on 09/05/2016 11:35:26 [name], [name] ([id number]) [name], [name] ([id number]) -------------------------------------------------------------------------------- Problem List Details Patient Name: [name], [name]. Date of Service: 09/05/2016 10:45 AM Medical Record Number: [id number] Patient  Account Number: [id number] Date of Birth/Sex: December 16, 1940 (75 y.o. Female) Treating RN: Montey Hora Primary Care Physician: Fulton Reek Other Clinician: Referring Physician: Fulton Reek Treating Physician/Extender: Cathie Olden in Treatment: 2 Active Problems ICD-10 Encounter Code Description Active Date Diagnosis B35.4 Tinea corporis 08/21/2016 Yes M33.10 Other dermatomyositis, organ involvement unspecified 08/21/2016 Yes Inactive Problems Resolved Problems Electronic Signature(s) Signed: 09/05/2016 3:11:37 PM By: Lawanda Cousins Entered By: Lawanda Cousins on 09/05/2016 15:11:37 [name], [name] ([id number]) -------------------------------------------------------------------------------- Progress Note Details Patient Name: [name]. Date of Service: 09/05/2016 10:45 AM Medical Record Number: [id number] Patient Account Number: [id number] Date of Birth/Sex: [birth year] (75 y.o. Female) Treating RN: Montey Hora Primary Care Physician: Fulton Reek Other Clinician: Referring Physician: Fulton Reek Treating Physician/Extender: Cathie Olden in Treatment: 2 Subjective Chief Complaint Information obtained from Patient patient presents for evaluation od wound to left arm. History of Present Illness (HPI) 08/21/16; his is a very pleasant 75 year old woman who lives at a local assisted living. She is accompanied today by the transportation person from her facility. She is here for a skin rash and an open area on her left dorsal arm. The history behind this is vague apparently she has been scratching at this area and rubbing at for at least a month perhaps longer to than that. It was brought to the attention of Dr. Doy Hutching who is the patient's primary [facility] and he gave her topical Bactroban as well as an oral antibiotic and apparently there has been some improvement although the area is still open. The facility is supposed to be  covering this was something to prevent the scratching although it is not clear that that is actually been taking place. I'm not exactly sure of the course of this area although the accompanying person seems to think it is some better. 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 I reviewed an extensive amount of lab work that came with her on her referral notes from 07/27/16 she had a normal comprehensive metabolic panel. Normal CBC and differential normal urinalysis TSH free T4 and free T3 all normal. 08/29/16; the arm is perhaps somewhat better  although only a little bit. Not as much change as I might of like to seen if my initial thought about tinea corpus was correct 09/05/2016 - overall area to the LFA appears improved with one primary ulcer and no observable satellite lesions or areas of drainage. She denies any irching and/or scratching to LFA Objective Constitutional BP within normal limits. afebrile. well nourished; well developed; appears stated age;Marland Kitchen Vitals Time Taken: 11:03 AM, Height: 62 in, Weight: 167.1 lbs, BMI: 30.6, Temperature: 98.2 F, Pulse: 78 bpm, Respiratory Rate: 16 breaths/min, Blood Pressure: 133/76 mmHg. [name], Jillian G. ([id number]) Respiratory non-labored respiratory effort. Psychiatric calm, pleasant. Integumentary - primary ulcer to LFA with no peri-ulcer satellite lesions, erythema, or induration; no evidence of scratching Integumentary (Hair, Skin) Wound #1 status is Open. Original cause of wound was Gradually Appeared. The wound is located on the Left,Posterior Forearm. The wound measures 2.4cm length x 2.5cm width x 0.2cm depth; 4.712cm^2 area and 0.942cm^3 volume. The wound is limited to skin breakdown. There is no tunneling or undermining noted. There is a medium  amount of serosanguinous drainage noted. The wound margin is distinct with the outline attached to the wound base. There is large (67-100%) red granulation within the wound bed. There is a small (1-33%) amount of necrotic tissue within the wound bed including Adherent Slough. The periwound skin appearance exhibited: Moist. The periwound skin appearance did not exhibit: Callus, Crepitus, Excoriation, Fluctuance, Friable, Induration, Localized Edema, Rash, Scarring, Dry/Scaly, Maceration, Atrophie Blanche, Cyanosis, Ecchymosis, Hemosiderin Staining, Mottled, Pallor, Rubor, Erythema. Periwound temperature was noted as No Abnormality. The periwound has tenderness on palpation. Assessment Active Problems ICD-10 B35.4 - Tinea corporis M33.10 - Other dermatomyositis, organ involvement unspecified Plan Wound Cleansing: Wound #1 Left,Posterior Forearm: Clean wound with Normal Saline. Cleanse wound with mild soap and water Anesthetic: Wound #1 Left,Posterior Forearm: Topical Lidocaine 4% cream applied to wound bed prior to debridement - for clinic use [name], KALER. ([id number]) Primary Wound Dressing: Prisma Ag - given to patient Secondary Dressing: Wound #1 Left,Posterior Forearm: Boardered Foam Dressing Non-adherent pad Dressing Change Frequency: Wound #1 Left,Posterior Forearm: Other: - change on Saturday Follow-up Appointments: Wound #1 Left,Posterior Forearm: Return Appointment in 1 week. Additional Orders / Instructions: Wound #1 Left,Posterior Forearm: Increase protein intake. Follow-Up Appointments: A follow-up appointment should be scheduled. A Patient Clinical Summary of Care was provided to CD Electronic Signature(s) Signed: 09/05/2016 3:17:54 PM By: Lawanda Cousins Entered By: Lawanda Cousins on 09/05/2016 15:17:54 [name], [name] ([id number]) -------------------------------------------------------------------------------- SuperBill Details Patient Name: [name] Date of Service: 09/05/2016 Medical Record Number: [id number] Patient Account Number: [id number] Date of Birth/Sex: [birth year] (75 y.o. 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

## 2016-09-06 NOTE — Progress Notes (Signed)
[name], Jillian ([id number]) Visit Report for 09/05/2016 Arrival Information Details Patient Name: [name], CANGE. Date of Service: 09/05/2016 10:45 AM Medical Record Number: [id number] Patient Account Number: [id number] Date of Birth/Sex: May 29, 1941 (75 y.o. Female) Treating RN: Montey Hora Primary Care Physician: Fulton Reek Other Clinician: Referring Physician: Fulton Reek Treating Physician/Extender: Cathie Olden in Treatment: 2 Visit Information History Since Last Visit Added or deleted any medications: No Patient Arrived: [name] Any new allergies or adverse reactions: No Arrival Time: 11:01 Had a fall or experienced change in No Accompanied By: self activities of daily living that may affect Transfer Assistance: None risk of falls: Patient Identification Verified: Yes Signs or symptoms of abuse/neglect since last No Secondary Verification Process Completed: Yes visito Patient Requires Transmission-Based No Hospitalized since last visit: No Precautions: Pain Present Now: No Patient Has Alerts: No Electronic Signature(s) Signed: 09/05/2016 5:47:50 PM By: Montey Hora Entered By: Montey Hora on 09/05/2016 11:03:11 Strassner, Keylin Darnell Level ([id number]) -------------------------------------------------------------------------------- Clinic Level of Care Assessment Details Patient Name: [name], [name]. Date of Service: 09/05/2016 10:45 AM Medical Record Number: [id number] Patient Account Number: [id number] Date of Birth/Sex: [birth year] (75 y.o. Female) Treating RN: Montey Hora Primary Care Physician: Fulton Reek Other Clinician: Referring Physician: Fulton Reek Treating Physician/Extender: Cathie Olden in Treatment: 2 Clinic Level of Care Assessment Items TOOL 4 Quantity Score []  - Use when only an EandM is performed on FOLLOW-UP visit 0 ASSESSMENTS - Nursing Assessment / Reassessment X - Reassessment of Co-morbidities (includes  updates in patient status) 1 10 X - Reassessment of Adherence to Treatment Plan 1 5 ASSESSMENTS - Wound and Skin Assessment / Reassessment X - Simple Wound Assessment / Reassessment - one wound 1 5 []  - Complex Wound Assessment / Reassessment - multiple wounds 0 []  - Dermatologic / Skin Assessment (not related to wound area) 0 ASSESSMENTS - Focused Assessment []  - Circumferential Edema Measurements - multi extremities 0 []  - Nutritional Assessment / Counseling / Intervention 0 []  - Lower Extremity Assessment (monofilament, tuning fork, pulses) 0 []  - Peripheral Arterial Disease Assessment (using hand held doppler) 0 ASSESSMENTS - Ostomy and/or Continence Assessment and Care []  - Incontinence Assessment and Management 0 []  - Ostomy Care Assessment and Management (repouching, etc.) 0 PROCESS - Coordination of Care X - Simple Patient / Family Education for ongoing care 1 15 []  - Complex (extensive) Patient / Family Education for ongoing care 0 []  - Staff obtains Programmer, systems, Records, Test Results / Process Orders 0 []  - Staff telephones HHA, Nursing Homes / Clarify orders / etc 0 []  - Routine Transfer to another Facility (non-emergent condition) 0 [name], Jillian G. ([id number]) []  - Routine Hospital Admission (non-emergent condition) 0 []  - New Admissions / Biomedical engineer / Ordering NPWT, Apligraf, etc. 0 []  - Emergency Hospital Admission (emergent condition) 0 X - Simple Discharge Coordination 1 10 []  - Complex (extensive) Discharge Coordination 0 PROCESS - Special Needs []  - Pediatric / Minor Patient Management 0 []  - Isolation Patient Management 0 []  - Hearing / Language / Visual special needs 0 []  - Assessment of Community assistance (transportation, D/C planning, etc.) 0 []  - Additional assistance / Altered mentation 0 []  - Support Surface(s) Assessment (bed, cushion, seat, etc.) 0 INTERVENTIONS - Wound Cleansing / Measurement X - Simple Wound Cleansing - one wound 1 5 []   - Complex Wound Cleansing - multiple wounds 0 X - Wound Imaging (photographs - any number of wounds) 1 5 []  - Wound Tracing (instead of photographs) 0 X -  Simple Wound Measurement - one wound 1 5 []  - Complex Wound Measurement - multiple wounds 0 INTERVENTIONS - Wound Dressings X - Small Wound Dressing one or multiple wounds 1 10 []  - Medium Wound Dressing one or multiple wounds 0 []  - Large Wound Dressing one or multiple wounds 0 []  - Application of Medications - topical 0 []  - Application of Medications - injection 0 INTERVENTIONS - Miscellaneous []  - External ear exam 0 [name], Jillian G. ([id number]) []  - Specimen Collection (cultures, biopsies, blood, body fluids, etc.) 0 []  - Specimen(s) / Culture(s) sent or taken to Lab for analysis 0 []  - Patient Transfer (multiple staff / Harrel Lemon Lift / Similar devices) 0 []  - Simple Staple / Suture removal (25 or less) 0 []  - Complex Staple / Suture removal (26 or more) 0 []  - Hypo / Hyperglycemic Management (close monitor of Blood Glucose) 0 []  - Ankle / Brachial Index (ABI) - do not check if billed separately 0 X - Vital Signs 1 5 Has the patient been seen at the hospital within the last three years: Yes Total Score: 75 Level Of Care: New/Established - Level 2 Electronic Signature(s) Signed: 09/05/2016 5:47:50 PM By: Montey Hora Entered By: Montey Hora on 09/05/2016 11:41:44 Bannan, Sheina Darnell Level ([id number]) -------------------------------------------------------------------------------- Encounter Discharge Information Details Patient Name: [name] Date of Service: 09/05/2016 10:45 AM Medical Record Number: [id number] Patient Account Number: [id number] Date of Birth/Sex: 1941/03/15 (75 y.o. Female) Treating RN: Montey Hora Primary Care Physician: Fulton Reek Other Clinician: Referring Physician: Fulton Reek Treating Physician/Extender: Cathie Olden in Treatment: 2 Encounter Discharge Information  Items Discharge Pain Level: 0 Discharge Condition: Stable Ambulatory Status: Walker Discharge Destination: Home Transportation: Private Auto Accompanied By: staff Schedule Follow-up Appointment: Yes Medication Reconciliation completed No and provided to Patient/Care Sarim Rothman: Provided on Clinical Summary of Care: 09/05/2016 Form Type Recipient Paper Patient CD Electronic Signature(s) Signed: 09/05/2016 11:43:17 AM By: Ruthine Dose Entered By: Ruthine Dose on 09/05/2016 11:43:17 Cabeza, Syra Darnell Level ([id number]) -------------------------------------------------------------------------------- Multi Wound Chart Details Patient Name: [name] Date of Service: 09/05/2016 10:45 AM Medical Record Number: [id number] Patient Account Number: [id number] Date of Birth/Sex: [birth year] (75 y.o. Female) Treating RN: Montey Hora Primary Care Physician: Fulton Reek Other Clinician: Referring Physician: Fulton Reek Treating Physician/Extender: Cathie Olden in Treatment: 2 Vital Signs Height(in): 62 Pulse(bpm): 78 Weight(lbs): 167.1 Blood Pressure 133/76 (mmHg): Body Mass Index(BMI): 31 Temperature(F): 98.2 Respiratory Rate 16 (breaths/min): Photos: [N/A:N/A] Wound Location: Left Forearm - Posterior N/A N/A Wounding Event: Gradually Appeared N/A N/A Primary Etiology: Infection - not elsewhere N/A N/A classified Comorbid History: Anemia, Hypertension, N/A N/A Phlebitis, Neuropathy Date Acquired: 06/21/2016 N/A N/A Weeks of Treatment: 2 N/A N/A Wound Status: Open N/A N/A Measurements L x W x D 2.4x2.5x0.2 N/A N/A (cm) Area (cm) : 4.712 N/A N/A Volume (cm) : 0.942 N/A N/A % Reduction in Area: -33.30% N/A N/A % Reduction in Volume: -166.90% N/A N/A Classification: Partial Thickness N/A N/A Exudate Amount: Medium N/A N/A Exudate Type: Sanguinous N/A N/A Exudate Color: red N/A N/A Wound Margin: Distinct, outline attached N/A N/A Granulation Amount:  Large (67-100%) N/A N/A Granulation Quality: Pink N/A N/A Necrotic Amount: Small (1-33%) N/A N/A Dullea, Mari G. ([id number]) Necrotic Tissue: Eschar, Adherent Slough N/A N/A Exposed Structures: Fascia: No N/A N/A Fat: No Tendon: No Muscle: No Joint: No Bone: No Limited to Skin Breakdown Epithelialization: Medium (34-66%) N/A N/A Periwound Skin Texture: Edema: No N/A N/A Excoriation: No Induration: No Callus: No Crepitus: No Fluctuance: No  Friable: No Rash: No Scarring: No Periwound Skin Moist: Yes N/A N/A Moisture: Maceration: No Dry/Scaly: No Periwound Skin Color: Atrophie Blanche: No N/A N/A Cyanosis: No Ecchymosis: No Erythema: No Hemosiderin Staining: No Mottled: No Pallor: No Rubor: No Temperature: No Abnormality N/A N/A Tenderness on Yes N/A N/A Palpation: Wound Preparation: Ulcer Cleansing: N/A N/A Rinsed/Irrigated with Saline Topical Anesthetic Applied: Other: lidocaine 4% Treatment Notes Electronic Signature(s) Signed: 09/05/2016 5:47:50 PM By: Montey Hora Entered By: Montey Hora on 09/05/2016 11:25:47 Vater, [name] ([id number]) -------------------------------------------------------------------------------- [place] Details Patient Name: ROBERTTA, FINNELL Date of Service: 09/05/2016 10:45 AM Medical Record Number: [id number] Patient Account Number: [id number] Date of Birth/Sex: July 31, 1941 (75 y.o. Female) Treating RN: Montey Hora Primary Care Physician: Fulton Reek Other Clinician: Referring Physician: Fulton Reek Treating Physician/Extender: Cathie Olden in Treatment: 2 Active Inactive Abuse / Safety / Falls / Self Care Management Nursing Diagnoses: Potential for falls Goals: Patient will remain injury free Date Initiated: 08/21/2016 Goal Status: Active Interventions: Assess fall risk on admission and as needed Assess impairment of mobility on admission and as needed per  policy Notes: Nutrition Nursing Diagnoses: Imbalanced nutrition Goals: Patient/caregiver agrees to and verbalizes understanding of need to use nutritional supplements and/or vitamins as prescribed Date Initiated: 08/21/2016 Goal Status: Active Interventions: Assess patient nutrition upon admission and as needed per policy Notes: Orientation to the Wound Care Program Nursing Diagnoses: Knowledge deficit related to the wound healing center program Goals: CATHYANN, WALZER ([id number]) Patient/caregiver will verbalize understanding of the [facility] Date Initiated: 08/21/2016 Goal Status: Active Interventions: Provide education on orientation to the wound center Notes: Wound/Skin Impairment Nursing Diagnoses: Impaired tissue integrity Goals: Ulcer/skin breakdown will have a volume reduction of 30% by week 4 Date Initiated: 08/21/2016 Goal Status: Active Ulcer/skin breakdown will have a volume reduction of 50% by week 8 Date Initiated: 08/21/2016 Goal Status: Active Ulcer/skin breakdown will have a volume reduction of 80% by week 12 Date Initiated: 08/21/2016 Goal Status: Active Interventions: Assess patient/caregiver ability to perform ulcer/skin care regimen upon admission and as needed Assess ulceration(s) every visit Notes: Electronic Signature(s) Signed: 09/05/2016 5:47:50 PM By: Montey Hora Entered By: Montey Hora on 09/05/2016 11:25:36 Nicholson, Marticia Darnell Level ([id number]) -------------------------------------------------------------------------------- Pain Assessment Details Patient Name: [name] Date of Service: 09/05/2016 10:45 AM Medical Record Number: [id number] Patient Account Number: [id number] Date of Birth/Sex: 20-Jan-1941 (75 y.o. Female) Treating RN: Montey Hora Primary Care Physician: Fulton Reek Other Clinician: Referring Physician: Fulton Reek Treating Physician/Extender: Cathie Olden in Treatment:  2 Active Problems Location of Pain Severity and Description of Pain Patient Has Paino No Site Locations Pain Management and Medication Current Pain Management: Notes Topical or injectable lidocaine is offered to patient for acute pain when surgical debridement is performed. If needed, Patient is instructed to use over the counter pain medication for the following 24-48 hours after debridement. Wound care MDs do not prescribed pain medications. Patient has chronic pain or uncontrolled pain. Patient has been instructed to make an appointment with their Primary Care Physician for pain management. Electronic Signature(s) Signed: 09/05/2016 5:47:50 PM By: Montey Hora Entered By: Montey Hora on 09/05/2016 11:03:29 Iseman, [name] ([id number]) -------------------------------------------------------------------------------- Patient/Caregiver Education Details Patient Name: [name] Date of Service: 09/05/2016 10:45 AM Medical Record Number: [id number] Patient Account Number: [id number] Date of Birth/Gender: 1941-01-22 (75 y.o. Female) Treating RN: Montey Hora Primary Care Physician: Fulton Reek Other Clinician: Referring Physician: Fulton Reek Treating Physician/Extender: Cathie Olden in Treatment: 2 Education Assessment  Education Provided To: Patient and Caregiver Education Topics Provided Wound/Skin Impairment: Handouts: Other: wound care as ordered and do not get bandage wet Methods: Demonstration, Explain/Verbal Responses: State content correctly Electronic Signature(s) Signed: 09/05/2016 5:47:50 PM By: Montey Hora Entered By: Montey Hora on 09/05/2016 11:42:46 Lua, Carianne G. ([id number]) -------------------------------------------------------------------------------- Wound Assessment Details Patient Name: Konkel, [name]. Date of Service: 09/05/2016 10:45 AM Medical Record Number: [id number] Patient Account Number: [id number] Date of  Birth/Sex: April 07, 1941 (75 y.o. Female) Treating RN: Montey Hora Primary Care Physician: Fulton Reek Other Clinician: Referring Physician: Fulton Reek Treating Physician/Extender: Cathie Olden in Treatment: 2 Wound Status Wound Number: 1 Primary Infection - not elsewhere classified Etiology: Wound Location: Left Forearm - Posterior Wound Status: Open Wounding Event: Gradually Appeared Comorbid Anemia, Hypertension, Phlebitis, Date Acquired: 06/21/2016 History: Neuropathy Weeks Of Treatment: 2 Clustered Wound: No Photos Wound Measurements Length: (cm) 2.4 Width: (cm) 2.5 Depth: (cm) 0.2 Area: (cm) 4.712 Volume: (cm) 0.942 % Reduction in Area: -33.3% % Reduction in Volume: -166.9% Epithelialization: Medium (34-66%) Tunneling: No Undermining: No Wound Description Classification: Partial Thickness Foul Odor Aft Wound Margin: Distinct, outline attached Exudate Amount: Medium Exudate Type: Sanguinous Exudate Color: red er Cleansing: No Wound Bed Granulation Amount: Large (67-100%) Exposed Structure Granulation Quality: Pink Fascia Exposed: No Necrotic Amount: Small (1-33%) Fat Layer Exposed: No Necrotic Quality: Eschar, Adherent Slough Tendon Exposed: No Muscle Exposed: No Sheeley, Marcel G. ([id number]) Joint Exposed: No Bone Exposed: No Limited to Skin Breakdown Periwound Skin Texture Texture Color No Abnormalities Noted: No No Abnormalities Noted: No Callus: No Atrophie Blanche: No Crepitus: No Cyanosis: No Excoriation: No Ecchymosis: No Fluctuance: No Erythema: No Friable: No Hemosiderin Staining: No Induration: No Mottled: No Localized Edema: No Pallor: No Rash: No Rubor: No Scarring: No Temperature / Pain Moisture Temperature: No Abnormality No Abnormalities Noted: No Tenderness on Palpation: Yes Dry / Scaly: No Maceration: No Moist: Yes Wound Preparation Ulcer Cleansing: Rinsed/Irrigated with Saline Topical Anesthetic  Applied: Other: lidocaine 4%, Treatment Notes Wound #1 (Left, Posterior Forearm) 1. Cleansed with: Clean wound with Normal Saline 2. Anesthetic Topical Lidocaine 4% cream to wound bed prior to debridement 4. Dressing Applied: Prisma Ag 5. Secondary Dressing Applied Bordered Foam Dressing Non-Adherent pad Notes netting Electronic Signature(s) Signed: 09/05/2016 5:47:50 PM By: Montey Hora Entered By: Montey Hora on 09/05/2016 11:15:59 Dominy, Reginald Darnell Level ([id number]) -------------------------------------------------------------------------------- [facility] Details Patient Name: [name] Date of Service: 09/05/2016 10:45 AM Medical Record Number: [id number] Patient Account Number: [id number] Date of Birth/Sex: 22-Jun-1941 (75 y.o. Female) Treating RN: Montey Hora Primary Care Physician: Fulton Reek Other Clinician: Referring Physician: Fulton Reek Treating Physician/Extender: Cathie Olden in Treatment: 2 Vital Signs Time Taken: 11:03 Temperature (F): 98.2 Height (in): 62 Pulse (bpm): 78 Weight (lbs): 167.1 Respiratory Rate (breaths/min): 16 Body Mass Index (BMI): 30.6 Blood Pressure (mmHg): 133/76 Reference Range: 80 - 120 mg / dl Electronic Signature(s) Signed: 09/05/2016 5:47:50 PM By: Montey Hora Entered By: Montey Hora on 09/05/2016 11:04:51

## 2016-09-12 ENCOUNTER — Encounter: Payer: Medicare PPO | Admitting: Internal Medicine

## 2016-09-12 DIAGNOSIS — M3310 Other dermatopolymyositis, organ involvement unspecified: Secondary | ICD-10-CM | POA: Diagnosis not present

## 2016-09-13 LAB — SURGICAL PATHOLOGY

## 2016-09-13 NOTE — Progress Notes (Signed)
[name] ([id number]) Visit Report for 09/12/2016 Biopsy Details Patient Name: [name], [name]. Date of Service: 09/12/2016 12:45 PM Medical Record Patient Account Number: [id number] Number: Treating RN: [name] 12/27/1940 (75 y.o. Other Clinician: Date of Birth/Sex: Female) Treating [name] Primary Care Physician/Extender: [name] Physician: Referring Physician: Betsey [name] in Treatment: 3 Biopsy Performed for: Wound #1 Left, Posterior Forearm Location(s): Wound Bed, Wound Margin Performed By: Physician Jillian Dillon, MD Tissue Punch: No Number of Specimens Taken: 2 Specimen Sent To Pathology: Yes Pre-procedure Verification/Time-Out Yes - 13:27 Taken: Pain Control: Lidocaine Injectable Lidocaine Percent: 2% Instrument: Blade, Forceps Bleeding: Moderate Hemostasis Achieved: Pressure Procedural Pain: 0 Post Procedural Pain: 0 Response to Treatment: Procedure was tolerated well Post Procedure Diagnosis Same as Pre-procedure Electronic Signature(s) Signed: 09/12/2016 4:57:31 PM By: Jillian Ham MD Entered By: [name] on 09/12/2016 13:38:40 [name], [name] ([id number]) -------------------------------------------------------------------------------- Chief Complaint Document Details Patient Name: [name]. Date of Service: 09/12/2016 12:45 PM Medical Record Patient Account Number: [id number] Number: Treating RN: [name] 07-12-1941 (75 y.o. Other Clinician: Date of Birth/Sex: Female) Treating [name] Primary Care Physician/Extender: [name] Physician: Referring Physician: Betsey [name] in Treatment: 3 Information Obtained from: Patient Chief Complaint patient presents for evaluation od wound to left arm. Electronic Signature(s) Signed: 09/12/2016 4:57:31 PM By: Jillian Ham MD Entered By: [name] on 09/12/2016 13:38:54 [name], [name] ([id number]) -------------------------------------------------------------------------------- HPI Details Patient Name: [name], [name] Date of Service: 09/12/2016 12:45 PM Medical Record Patient Account Number: [id number] Number: Treating RN: [name] 18-Dec-1940 (75 y.o. Other Clinician: Date of Birth/Sex: Female) Treating [name] Primary Care Physician/Extender: [name] Physician: Referring Physician: Betsey [name] in Treatment: 3 History of Present Illness HPI Description: 08/21/16; his is a very pleasant 75 year old woman who lives at a local assisted living. She is accompanied today by the transportation person from her facility. She is here for a skin rash and an open area on her left dorsal arm. The history behind this is vague apparently she has been scratching at this area and rubbing at for at least a month perhaps longer to than that. It was brought to the attention of Dr. Doy [name] who is the patient[facility] and he gave her topical Bactroban as well as an oral antibiotic and apparently there has been some improvement although the area is still open. The facility is supposed to be covering this was something to prevent the scratching although it is not clear that that is actually been taking place. I'm not exactly sure of the course of this area although the accompanying person seems to think it is some better. 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 I reviewed an extensive amount of lab work that came with her on her referral notes from 07/27/16 she had a normal comprehensive metabolic panel. Normal CBC and differential normal urinalysis TSH free T4 and free T3 all normal. 08/29/16; the arm is perhaps somewhat  better although only a little bit. Not as much change as I might of like to seen if my initial thought about tinea corpus was correct 09/05/2016 - overall area to the LFA appears improved with one primary ulcer and no observable satellite lesions or areas of drainage. She denies any irching and/or  scratching to LFA 09/12/16; no major change in this area still an irregular raised area on the left forearm. Topical antifungal cream does not seem to have had much difference. There is now an open area with a wound in the inferior part of this which according to our nurse was not there last week. Shave biopsies o2 done Electronic Signature(s) Signed: 09/12/2016 4:57:31 PM By: Jillian Ham MD Entered By: [name] on 09/12/2016 13:39:56 [name], [name] ([id number]) -------------------------------------------------------------------------------- Physical Exam Details Patient Name: [name], [name]. Date of Service: 09/12/2016 12:45 PM Medical Record Patient Account Number: [id number] Number: Treating RN: [name] 12/12/1940 (75 y.o. Other Clinician: Date of Birth/Sex: Female) Treating Otoniel Myhand Primary Care Physician/Extender: [name] Physician: Referring Physician: Betsey [name] in Treatment: 3 Constitutional Sitting or standing Blood Pressure is within target range for patient.. Pulse regular and within target range for patient.Marland Kitchen Respirations regular, non-labored and within target range.. Temperature is normal and within the target range for the patient.. Patient's appearance is neat and clean. Appears in no acute distress. Well nourished and well developed.. Notes Wound exam; the areas on the dorsal left forearm. Raised edges well demarcated from normal skin. Irregular topography. Open area in the inferior aspect of the wound. Shave biopsy in 2 sites 1 on the side of the open area and one separate from normal skin. Electronic  Signature(s) Signed: 09/12/2016 4:57:31 PM By: Jillian Ham MD Entered By: [name] on 09/12/2016 13:41:27 [name], [name] ([id number]) -------------------------------------------------------------------------------- Physician Orders Details Patient Name: [name] Date of Service: 09/12/2016 12:45 PM Medical Record Patient Account Number: [id number] Number: Treating RN: [name] 08-04-1941 (75 y.o. Other Clinician: Date of Birth/Sex: Female) Treating Oren Barella Primary Care Physician/Extender: [name] Physician: Referring Physician: Betsey [name] in Treatment: 3 Verbal / Phone Orders: Yes Clinician: Montey [name] Read Back and Verified: Yes Diagnosis Coding Wound Cleansing Wound #1 Left,Posterior Forearm o Clean wound with Normal Saline. o Cleanse wound with mild soap and water Anesthetic Wound #1 Left,Posterior Forearm o Topical Lidocaine 4% cream applied to wound bed prior to debridement - for clinic use Primary Wound Dressing o Prisma Ag - given to patient Secondary Dressing Wound #1 Left,Posterior Forearm o Boardered Foam Dressing o Non-adherent pad Dressing Change Frequency Wound #1 Left,Posterior Forearm o Other: - change on Saturday Follow-up Appointments Wound #1 Left,Posterior Forearm o Return Appointment in 1 week. Additional Orders / Instructions Wound #1 Left,Posterior Forearm o Increase protein intake. Laboratory o Bacteria identified in Tissue by Biopsy culture (MICRO) oooo LOINC Code: [id number] LOLAMAE, BAUMHOVER ([id number]) oooo Convenience Name: Biospy specimen culture Electronic Signature(s) Signed: 09/12/2016 1:55:43 PM By: [name] Signed: 09/12/2016 4:57:31 PM By: Jillian Ham MD Entered By: [name] on 09/12/2016 13:32:51 Barren, [name] ([id number]) -------------------------------------------------------------------------------- Problem List  Details Patient Name: Keelin, [name]. Date of Service: 09/12/2016 12:45 PM Medical Record Patient Account Number: [id number] Number: Treating RN: [name] [birth year] (75 y.o. 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 Date of Service: 09/12/2016 12:45 PM Medical Record Patient Account  Number: [id number] Number: Treating RN: [name] 06-22-1941 (75 y.o. Other Clinician: Date of Birth/Sex: Female) Treating Dorianne Perret Primary Care Physician/Extender: [name] Physician: Referring Physician: Betsey [name] in Treatment: 3 Subjective Chief Complaint Information obtained from Patient patient presents for evaluation od wound to left arm. History of Present Illness (HPI) 08/21/16; his is a very pleasant 75 year old woman who lives at a local assisted living. She is accompanied today by the transportation person from her facility. She is here for a skin rash and an open area on her left dorsal arm. The history behind this is vague apparently she has been scratching at this area and rubbing at for at least a month perhaps longer to than that. It was brought to the attention of Dr. Doy [name] who is the [facility] and he gave her topical  Bactroban as well as an oral antibiotic and apparently there has been some improvement although the area is still open. The facility is supposed to be covering this was something to prevent the scratching although it is not clear that that is actually been taking place. I'm not exactly sure of the course of this area although the accompanying person seems to think it is some better. 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 I reviewed an extensive amount of lab work that came with her on her referral notes from 07/27/16 she had a normal comprehensive metabolic panel. Normal CBC and differential normal urinalysis TSH free T4 and free T3 all normal. 08/29/16; the arm is perhaps somewhat better although only a little bit. Not as much change as I might of like to seen if my initial thought about tinea corpus was correct 09/05/2016 - overall area to the LFA appears improved with one primary ulcer and no observable satellite lesions or areas of drainage. She denies any irching and/or scratching to LFA 09/12/16; no major change in this area still an irregular raised area on the left forearm. Topical antifungal cream does not seem to have had much difference. There is now an open area with a wound in the inferior part of this which according to our nurse was not there last week. Shave biopsies o2 done Objective Borkenhagen, Katja G. ([id number]) Constitutional Sitting or standing Blood Pressure is within target range for patient.. Pulse regular and within target range for patient.Marland Kitchen Respirations regular, non-labored and within target range.. Temperature is normal and within the target range for the patient.. Patient's appearance is neat and clean. Appears in no acute distress. Well nourished and well developed.. Vitals  Time Taken: 1:05 PM, Height: 62 in, Weight: 167.1 lbs, BMI: 30.6, Temperature: 97.9 F, Pulse: 75 bpm, Respiratory Rate: 18 breaths/min, Blood Pressure: 131/71 mmHg. General Notes: Wound exam; the areas on the dorsal left forearm. Raised edges well demarcated from normal skin. Irregular topography. Open area in the inferior aspect of the wound. Shave biopsy in 2 sites 1 on the side of the open area and one separate from normal skin. Integumentary (Hair, Skin) Wound #1 status is Open. Original cause of wound was Gradually Appeared. The wound is located on the Left,Posterior Forearm. The wound measures 2.9cm length x 2cm width x 0.1cm depth; 4.555cm^2 area and 0.456cm^3 volume. The wound is limited to skin breakdown. There is no tunneling or undermining noted. There is a  medium amount of sanguinous drainage noted. The wound margin is distinct with the outline attached to the wound base. There is large (67-100%) pink granulation within the wound bed. There is a small (1-33%) amount of necrotic tissue within the wound bed including Eschar and Adherent Slough. The periwound skin appearance exhibited: Moist. The periwound skin appearance did not exhibit: Callus, Crepitus, Excoriation, Fluctuance, Friable, Induration, Localized Edema, Rash, Scarring, Dry/Scaly, Maceration, Atrophie Blanche, Cyanosis, Ecchymosis, Hemosiderin Staining, Mottled, Pallor, Rubor, Erythema. Periwound temperature was noted as No Abnormality. The periwound has tenderness on palpation. Assessment Active Problems ICD-10 B35.4 - Tinea corporis M33.10 - Other dermatomyositis, organ involvement unspecified Procedures Wound #1 Wound #1 is an Infection - not elsewhere classified located on the Left, Posterior Forearm . There was a biopsy performed by Jillian Dillon, MD. There was a biopsy performed on Wound Bed, Wound Belter, Jillian G. ([id number]) Margin. The skin was cleansed and prepped with anti-septic followed by pain  control using Lidocaine Injectable: 2%. Tissue was removed at its base with the following instrument(s): Blade and Forceps and sent to pathology. A Moderate amount of bleeding was controlled with Pressure. A time out was conducted at 13:27, prior to the start of the procedure. The procedure was tolerated well with a pain level of 0 throughout and a pain level of 0 following the procedure. Post procedure Diagnosis Wound #1: Same as Pre-Procedure Plan Wound Cleansing: Wound #1 Left,Posterior Forearm: Clean wound with Normal Saline. Cleanse wound with mild soap and water Anesthetic: Wound #1 Left,Posterior Forearm: Topical Lidocaine 4% cream applied to wound bed prior to debridement - for clinic use Primary Wound Dressing: Prisma Ag - given to patient Secondary Dressing: Wound #1 Left,Posterior Forearm: Boardered Foam Dressing Non-adherent pad Dressing Change Frequency: Wound #1 Left,Posterior Forearm: Other: - change on Saturday Follow-up Appointments: Wound #1 Left,Posterior Forearm: Return Appointment in 1 week. Additional Orders / Instructions: Wound #1 Left,Posterior Forearm: Increase protein intake. Laboratory ordered were: Biospy specimen culture prisma/gauze; Shave biopsy x2 done Electronic Signature(s) Signed: 09/12/2016 4:57:31 PM By: Jillian Ham MD [name] ([id number]) Entered By: [name] on 09/12/2016 13:42:28 Spychalski, [name] ([id number]) -------------------------------------------------------------------------------- [place] Details Patient Name: [name] Date of Service: 09/12/2016 Medical Record Patient Account Number: [id number] Number: Treating RN: [name] 1941/04/02 (75 y.o. 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

## 2016-09-13 NOTE — Progress Notes (Signed)
[name], [name] ([id number]) Visit Report for 09/12/2016 Arrival Information Details Patient Name: [name], [name]. Date of Service: 09/12/2016 12:45 PM Medical Record Patient Account Number: [id number] Number: Treating RN: Montey Hora [birth year] (75 y.o. Other Clinician: Date of Birth/Sex: Female) Treating ROBSON, MICHAEL Primary Care Physician: Fulton Reek Physician/Extender: G Referring Physician: Betsey Holiday in Treatment: 3 Visit Information History Since Last Visit Added or deleted any medications: No Patient Arrived: [place] Any new allergies or adverse reactions: No Arrival Time: 13:03 Had a fall or experienced change in No Accompanied By: self activities of daily living that may affect Transfer Assistance: None risk of falls: Patient Identification Verified: Yes Signs or symptoms of abuse/neglect since last No Secondary Verification Process Completed: Yes visito Patient Requires Transmission-Based No Hospitalized since last visit: No Precautions: Pain Present Now: No Patient Has Alerts: No Electronic Signature(s) Signed: 09/12/2016 1:55:43 PM By: Montey Hora Entered By: Montey Hora on 09/12/2016 13:03:36 [name], [name] ([id number]) -------------------------------------------------------------------------------- Encounter Discharge Information Details Patient Name: [name] Date of Service: 09/12/2016 12:45 PM Medical Record Patient Account Number: [id number] Number: Treating RN: Montey Hora 05-Sep-1941 (75 y.o. Other Clinician: Date of Birth/Sex: Female) Treating ROBSON, MICHAEL Primary Care Physician: Fulton Reek Physician/Extender: G Referring Physician: Betsey Holiday in Treatment: 3 Encounter Discharge Information Items Discharge Pain [name]: 0 Discharge Condition: Stable Ambulatory Status: Wheelchair Discharge Destination: Home Transportation: Private Auto Accompanied By:  self Schedule Follow-up Appointment: Yes Medication Reconciliation completed and provided to Patient/Care No [name]: Provided on Clinical Summary of Care: 09/12/2016 Form Type Recipient Paper Patient CD Electronic Signature(s) Signed: 09/12/2016 1:54:22 PM By: Montey Hora Previous Signature: 09/12/2016 1:46:03 PM Version By: Ruthine Dose Entered By: Montey Hora on 09/12/2016 13:54:21 [name], [name] ([id number]) -------------------------------------------------------------------------------- Multi Wound Chart Details Patient Name: [name]. Date of Service: 09/12/2016 12:45 PM Medical Record Patient Account Number: [id number] Number: Treating RN: Montey Hora Aug 31, 1941 (75 y.o. Other Clinician: Date of Birth/Sex: Female) Treating ROBSON, [place] Primary Care Physician: Fulton Reek Physician/Extender: G Referring Physician: Betsey Holiday in Treatment: 3 Vital Signs Height(in): 62 Pulse(bpm): 75 Weight(lbs): 167.1 Blood Pressure 131/71 (mmHg): Body Mass Index(BMI): 31 Temperature(F): 97.9 Respiratory Rate 18 (breaths/min): Photos: [N/A:N/A] Wound Location: Left Forearm - Posterior N/A N/A Wounding Event: Gradually Appeared N/A N/A Primary Etiology: Infection - not elsewhere N/A N/A classified Comorbid History: Anemia, Hypertension, N/A N/A Phlebitis, Neuropathy Date Acquired: 06/21/2016 N/A N/A Weeks of Treatment: 3 N/A N/A Wound Status: Open N/A N/A Measurements L x W x D 2.9x2x0.1 N/A N/A (cm) Area (cm) : 4.555 N/A N/A Volume (cm) : 0.456 N/A N/A % Reduction in Area: -28.90% N/A N/A % Reduction in Volume: -29.20% N/A N/A Classification: Partial Thickness N/A N/A Exudate Amount: Medium N/A N/A Exudate Type: Sanguinous N/A N/A Exudate Color: red N/A N/A Wound Margin: Distinct, outline attached N/A N/A Granulation Amount: Large (67-100%) N/A N/A Blowers, Chase G. ([id number]) Granulation Quality: Pink N/A  N/A Necrotic Amount: Small (1-33%) N/A N/A Necrotic Tissue: Eschar, Adherent Slough N/A N/A Exposed Structures: Fascia: No N/A N/A Fat: No Tendon: No Muscle: No Joint: No Bone: No Limited to Skin Breakdown Epithelialization: Medium (34-66%) N/A N/A Periwound Skin Texture: Edema: No N/A N/A Excoriation: No Induration: No Callus: No Crepitus: No Fluctuance: No Friable: No Rash: No Scarring: No Periwound Skin Moist: Yes N/A N/A Moisture: Maceration: No Dry/Scaly: No Periwound Skin Color: Atrophie Blanche: No N/A N/A Cyanosis: No Ecchymosis: No Erythema: No Hemosiderin Staining: No Mottled: No Pallor: No Rubor: No  Temperature: No Abnormality N/A N/A Tenderness on Yes N/A N/A Palpation: Wound Preparation: Ulcer Cleansing: N/A N/A Rinsed/Irrigated with Saline Topical Anesthetic Applied: Other: lidocaine 4% Treatment Notes Electronic Signature(s) Signed: 09/12/2016 1:55:43 PM By: Montey Hora Entered By: Montey Hora on 09/12/2016 13:29:10 [name], [name] ([id number]) [name], [name] ([id number]) -------------------------------------------------------------------------------- [place] Details Patient Name: [name] Date of Service: 09/12/2016 12:45 PM Medical Record Patient Account Number: [id number] Number: Treating RN: Montey Hora [birth year] (75 y.o. Other Clinician: Date of Birth/Sex: Female) Treating ROBSON, MICHAEL Primary Care Physician: Fulton Reek Physician/Extender: G Referring Physician: Betsey Holiday in Treatment: 3 Active Inactive Abuse / Safety / Falls / Self Care Management Nursing Diagnoses: Potential for falls Goals: Patient will remain injury free Date Initiated: 08/21/2016 Goal Status: Active Interventions: Assess fall risk on admission and as needed Assess impairment of mobility on admission and as needed per policy Notes: Nutrition Nursing Diagnoses: Imbalanced  nutrition Goals: Patient/caregiver agrees to and verbalizes understanding of need to use nutritional supplements and/or vitamins as prescribed Date Initiated: 08/21/2016 Goal Status: Active Interventions: Assess patient nutrition upon admission and as needed per policy Notes: Orientation to the Wound Care Program Nursing Diagnoses: Knowledge deficit related to the wound healing center program [name], [name] ([id number]) Goals: Patient/caregiver will verbalize understanding of the [place] Date Initiated: 08/21/2016 Goal Status: Active Interventions: Provide education on orientation to the wound center Notes: Wound/Skin Impairment Nursing Diagnoses: Impaired tissue integrity Goals: Ulcer/skin breakdown will have a volume reduction of 30% by week 4 Date Initiated: 08/21/2016 Goal Status: Active Ulcer/skin breakdown will have a volume reduction of 50% by week 8 Date Initiated: 08/21/2016 Goal Status: Active Ulcer/skin breakdown will have a volume reduction of 80% by week 12 Date Initiated: 08/21/2016 Goal Status: Active Interventions: Assess patient/caregiver ability to perform ulcer/skin care regimen upon admission and as needed Assess ulceration(s) every visit Notes: Electronic Signature(s) Signed: 09/12/2016 1:55:43 PM By: Montey Hora Entered By: Montey Hora on 09/12/2016 13:28:57 Prevo, Loyal Darnell [name] ([id number]) -------------------------------------------------------------------------------- Pain Assessment Details Patient Name: [name] Date of Service: 09/12/2016 12:45 PM Medical Record Patient Account Number: [id number] Number: Treating RN: Montey Hora 1941/03/01 (75 y.o. Other Clinician: Date of Birth/Sex: Female) Treating ROBSON, [employer] Primary Care Physician: Fulton Reek Physician/Extender: G Referring Physician: Betsey Holiday in Treatment: 3 Active Problems Location of Pain Severity and  Description of Pain Patient Has Paino No Site Locations Pain Management and Medication Current Pain Management: Notes Topical or injectable lidocaine is offered to patient for acute pain when surgical debridement is performed. If needed, Patient is instructed to use over the counter pain medication for the following 24-48 hours after debridement. Wound care MDs do not prescribed pain medications. Patient has chronic pain or uncontrolled pain. Patient has been instructed to make an appointment with their Primary Care Physician for pain management. Electronic Signature(s) Signed: 09/12/2016 1:55:43 PM By: Montey Hora Entered By: Montey Hora on 09/12/2016 13:03:43 [name], [name] ([id number]) -------------------------------------------------------------------------------- Patient/Caregiver Education Details Patient Name: [name] Date of Service: 09/12/2016 12:45 PM Medical Record Patient Account Number: [id number] Number: Treating RN: Montey Hora Mar 10, 1941 (75 y.o. Other Clinician: Date of Birth/Gender: Female) Treating ROBSON, [place] Primary Care Physician: Fulton Reek Physician/Extender: G Referring Physician: Betsey Holiday in Treatment: 3 Education Assessment Education Provided To: Patient Education Topics Provided Wound/Skin Impairment: Handouts: Other: wound care as ordered Methods: Demonstration, Explain/Verbal Responses: State content correctly Electronic Signature(s) Signed: 09/12/2016 1:55:43 PM By: Montey Hora Entered By:  Montey Hora on 09/12/2016 13:54:39 [name], [name] ([id number]) -------------------------------------------------------------------------------- Wound Assessment Details Patient Name: [name], [name]. Date of Service: 09/12/2016 12:45 PM Medical Record Patient Account Number: [id number] Number: Treating RN: Montey Hora 02-27-1941 (75 y.o. Other Clinician: Date of  Birth/Sex: Female) Treating ROBSON, MICHAEL Primary Care Physician: Fulton Reek Physician/Extender: G Referring Physician: Betsey Holiday in Treatment: 3 Wound Status Wound Number: 1 Primary Infection - not elsewhere classified Etiology: Wound Location: Left Forearm - Posterior Wound Status: Open Wounding Event: Gradually Appeared Comorbid Anemia, Hypertension, Phlebitis, Date Acquired: 06/21/2016 History: Neuropathy Weeks Of Treatment: 3 Clustered Wound: No Photos Wound Measurements Length: (cm) 2.9 Width: (cm) 2 Depth: (cm) 0.1 Area: (cm) 4.555 Volume: (cm) 0.456 % Reduction in Area: -28.9% % Reduction in Volume: -29.2% Epithelialization: Medium (34-66%) Tunneling: No Undermining: No Wound Description Classification: Partial Thickness Wound Margin: Distinct, outline attached Exudate Amount: Medium Exudate Type: Sanguinous Exudate Color: red Foul Odor After Cleansing: No Wound Bed Granulation Amount: Large (67-100%) Exposed Structure Granulation Quality: Pink Fascia Exposed: No Necrotic Amount: Small (1-33%) Fat Layer Exposed: No Zwart, Keiasia G. ([id number]) Necrotic Quality: Eschar, Adherent Slough Tendon Exposed: No Muscle Exposed: No Joint Exposed: No Bone Exposed: No Limited to Skin Breakdown Periwound Skin Texture Texture Color No Abnormalities Noted: No No Abnormalities Noted: No Callus: No Atrophie Blanche: No Crepitus: No Cyanosis: No Excoriation: No Ecchymosis: No Fluctuance: No Erythema: No Friable: No Hemosiderin Staining: No Induration: No Mottled: No Localized Edema: No Pallor: No Rash: No Rubor: No Scarring: No Temperature / Pain Moisture Temperature: No Abnormality No Abnormalities Noted: No Tenderness on Palpation: Yes Dry / Scaly: No Maceration: No Moist: Yes Wound Preparation Ulcer Cleansing: Rinsed/Irrigated with Saline Topical Anesthetic Applied: Other: lidocaine 4%, Treatment Notes Wound #1 (Left,  Posterior Forearm) 1. Cleansed with: Clean wound with Normal Saline 2. Anesthetic Topical Lidocaine 4% cream to wound bed prior to debridement 3. Peri-wound Care: Skin Prep 4. Dressing Applied: Prisma Ag 5. Secondary Dressing Applied Bordered Foam Dressing Non-Adherent pad Notes netting Electronic Signature(s) Signed: 09/12/2016 1:55:43 PM By: Gardiner Barefoot, [name] ([id number]) Entered By: Montey Hora on 09/12/2016 13:28:46 [name], [name] ([id number]) -------------------------------------------------------------------------------- [place] Details Patient Name: [name] Date of Service: 09/12/2016 12:45 PM Medical Record Patient Account Number: [id number] Number: Treating RN: Montey Hora [birth year] (75 y.o. Other Clinician: Date of Birth/Sex: Female) Treating ROBSON, [place] Primary Care Physician: Fulton Reek Physician/Extender: G Referring Physician: Betsey Holiday in Treatment: 3 Vital Signs Time Taken: 13:05 Temperature (F): 97.9 Height (in): 62 Pulse (bpm): 75 Weight (lbs): 167.1 Respiratory Rate (breaths/min): 18 Body Mass Index (BMI): 30.6 Blood Pressure (mmHg): 131/71 Reference Range: 80 - 120 mg / dl Electronic Signature(s) Signed: 09/12/2016 1:55:43 PM By: Montey Hora Entered By: Montey Hora on 09/12/2016 13:27:02

## 2016-09-19 ENCOUNTER — Encounter: Payer: Medicare PPO | Admitting: Nurse Practitioner

## 2016-09-19 DIAGNOSIS — M3310 Other dermatopolymyositis, organ involvement unspecified: Secondary | ICD-10-CM | POA: Diagnosis not present

## 2016-09-25 NOTE — Progress Notes (Signed)
Jillian, [name] ([id number]) Visit Report for 09/19/2016 Chief Complaint Document Details Patient Name: [name], [name] 09/19/2016 10:45 Date of Service: AM Medical Record [id number] Number: Patient Account Number: [id number] [birth year] (75 y.o. Treating RN: [name] Date of Birth/Sex: Female) Other Clinician: Primary Care Physician: [name] Referring Physician: Fulton [name] Physician/Extender: [name] in [name]: 4 Information Obtained from: Patient Chief Complaint patient presents for evaluation od wound to left arm. Electronic Signature(s) Signed: 09/19/2016 1:13:09 PM By: Jillian Kocher, NP, [name] By: Jillian Kocher, NP, [name] on 09/19/2016 13:13:09 Vardaman, [name] ([id number]) -------------------------------------------------------------------------------- HPI Details Patient Name: [name], [name] 09/19/2016 10:45 Date of Service: AM Medical Record [id number] Number: Patient Account Number: [id number] 1941-08-14 (75 y.o. Treating RN: [name] Date of Birth/Sex: Female) Other Clinician: Primary Care Physician: [name] Referring Physician: Fulton [name] Physician/Extender: [name] in [name]: 4 History of Present Illness HPI Description: 08/21/16; his is a very pleasant 75 year old woman who lives at a local assisted living. She is accompanied today by the transportation person from her facility. She is here for a skin rash and an open area on her left dorsal arm. The history behind this is vague apparently she has been scratching at this area and rubbing at for at least a month perhaps longer to than that. It was brought to the attention of [name] who is the [facility] and he gave her topical Bactroban as well as an oral antibiotic and apparently there has been some improvement although the area is still open. The facility is supposed to be covering  this was something to prevent the scratching although it is not clear that that is actually been taking place. I'm not exactly sure of the course of this area although the accompanying person seems to think it is some better. 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 I reviewed an extensive amount of lab work that came with her on her referral notes from 07/27/16 she had a normal comprehensive metabolic panel. Normal CBC and differential normal urinalysis TSH free T4 and free T3 all normal. 08/29/16; the arm is perhaps somewhat better although only a little bit. Not as much change as I might of like to seen if my initial thought about tinea corpus was correct 09/05/2016 - overall area to the LFA appears improved with one primary ulcer and no observable satellite lesions or areas of drainage. She denies any irching and/or scratching to LFA 09/12/16; no major change in this area still an irregular raised area on the left forearm. Topical antifungal cream does not seem to have had much difference. There is now an open area with a wound in the inferior part of this which according to our nurse was not there last week. Shave biopsies o2 done 09-19-16 Mr. Retter presents for follow-up on her left forearm ulceration. She is accompanied by facility staff. Biopsies taken from last week came back as basal cell carcinoma. The pathology report was discussed with her primary wound care provider, Dr. Dellia [name], prior to today. Electronic Signature(s) Signed: 09/19/2016 1:14:36 PM By: Jillian Kocher, NP, [name] By: Jillian Kocher, NP, [name] on 09/19/2016 13:14:36 [name] ([id number]) -------------------------------------------------------------------------------- Physical Exam Details Patient Name: [name], [name]  09/19/2016 10:45 Date of Service: AM Medical Record [id number] Number: Patient Account Number: [id number] 09-Apr-1941 (  75 y.o. Treating RN: [name] Date of Birth/Sex: Female) Other Clinician: Primary Care Physician: Jillian Oats, [name] Referring Physician: Fulton [name] Physician/Extender: [name]: 4 Constitutional BP within normal limits. afebrile. Respiratory non-labored respiratory effort. Musculoskeletal ambulated without assistance; steady gait. Integumentary (Hair, Skin) left forearm ulceration appears dry, no new or extending lesions noted. no induration, no fluctuance, denies pain. Psychiatric calm, pleasant, conversive. Notes discussion had with patient and facility staff regarding Dr. Janalyn Rouse request that she be seen by [facility] in [place]. The facility staff accompanies Mr. Fournier today is also the staff responsible for scheduling appointments. She was instructed to call the surgical center to schedule an appointment if she has not heard from them by next Wednesday or Thursday. Electronic Signature(s) Signed: 09/19/2016 1:35:51 PM By: Jillian Kocher, NP, [name] By: Jillian Kocher, NP, Jarrod Bodkins on 09/19/2016 13:35:50 Storti, [name] ([id number]) -------------------------------------------------------------------------------- Physician Orders Details Patient Name: [name], [name] 09/19/2016 10:45 Date of Service: AM Medical Record [id number] Number: Patient Account Number: [id number] [birth year] (75 y.o. Treating RN: [name] Date of Birth/Sex: Female) Other Clinician: Primary Care Physician: Jillian Oats, Bosten Newstrom Referring Physician: Fulton [name] Physician/Extender: [name] in [name]: 4 Verbal / Phone Orders: Yes Clinician: Montey [name] Read Back and Verified: Yes Diagnosis Coding Wound Cleansing Wound #1 Left,Posterior Forearm o Clean wound with Normal Saline. o Cleanse wound  with mild soap and water Secondary Dressing Wound #1 Left,Posterior Forearm o Boardered Foam Dressing Dressing Change Frequency Wound #1 Left,Posterior Forearm o Three times weekly Follow-up Appointments Wound #1 Left,Posterior Forearm o Return Appointment in 2 Jillian. Additional Orders / Instructions Wound #1 Left,Posterior Forearm o Increase protein intake. Home Health Wound #1 Left,Posterior Forearm o [place] Nurse may visit PRN to address patientos wound care needs. o FACE TO FACE ENCOUNTER: MEDICARE and MEDICAID PATIENTS: I certify that this patient is under my care and that I had a face-to-face encounter that meets the physician face-to-face encounter requirements with this patient on this date. The encounter with the patient was in whole or in part for the following MEDICAL CONDITION: (primary reason for [place]) MEDICAL NECESSITY: I certify, that based on my findings, NURSING services are a medically necessary home health service. HOME BOUND STATUS: I certify that my clinical findings support that this patient is homebound (i.e., Due to illness or injury, pt requires aid of supportive devices such as crutches, cane, wheelchairs, walkers, the use of special transportation or the assistance of another person to leave their place of residence. There is a Chaffin, Bridgit G. ([id number]) normal inability to leave the home and doing so requires considerable and taxing effort. Other absences are for medical reasons / religious services and are infrequent or of short duration when for other reasons). o Please direct any NON-WOUND related issues/requests for orders to patient's Primary Care Physician Consults o Dermatology - for surgical removal of skin cancer Electronic Signature(s) Signed: 09/19/2016 3:35:01 PM By: [name] Signed: 09/25/2016 9:26:16 AM By: Jillian Kocher, NP, Chandi Nicklin Entered By: [name] on 09/19/2016  11:22:13 Rozelle, [name] ([id number]) -------------------------------------------------------------------------------- Problem List Details Patient Name: [name], [name] 09/19/2016 10:45 Date of Service: AM Medical Record [id number] Number: Patient Account Number: [id number] [birth year] (75 y.o. Treating RN: [name] Date of Birth/Sex: Female) Other Clinician: Primary Care Physician: Jillian Oats, Louanne Calvillo Referring Physician: Fulton [name] Physician/Extender: [name] in [name]: 4 Active Problems ICD-10 Encounter Code Description Active Date Diagnosis C44.619 Basal cell carcinoma of skin of  left upper limb, including 09/19/2016 Yes shoulder M33.10 Other dermatomyositis, organ involvement unspecified 08/21/2016 Yes Inactive Problems Resolved Problems ICD-10 Code Description Active Date Resolved Date B35.4 Tinea corporis 08/21/2016 08/21/2016 Electronic Signature(s) Signed: 09/19/2016 1:05:57 PM By: Jillian Kocher, NP, Haly Feher Previous Signature: 09/19/2016 1:04:12 PM Version By: Jillian Kocher, NP, Hally Colella Entered By: Jillian Kocher, NP, Katrenia Alkins on 09/19/2016 13:05:57 Sistrunk, [name] ([id number]) -------------------------------------------------------------------------------- Progress Note Details Patient Name: [name], [name] 09/19/2016 10:45 Date of Service: AM Medical Record [id number] Number: Patient Account Number: [id number] 1941/01/25 (75 y.o. Treating RN: [name] Date of Birth/Sex: Female) Other Clinician: Primary Care Physician: Jillian Oats, Bonnetta Allbee Referring Physician: Fulton [name] Physician/Extender: [name] in [name]: 4 Subjective Chief Complaint Information obtained from Patient patient presents for evaluation od wound to left arm. History of Present Illness (HPI) 08/21/16; his is a very pleasant 75 year old woman who lives at a local assisted living. She is accompanied today by the transportation person from her  facility. She is here for a skin rash and an open area on her left dorsal arm. The history behind this is vague apparently she has been scratching at this area and rubbing at for at least a month perhaps longer to than that. It was brought to the attention of [name] who is the [facility] and he gave her topical Bactroban as well as an oral antibiotic and apparently there has been some improvement although the area is still open. The facility is supposed to be covering this was something to prevent the scratching although it is not clear that that is actually been taking place. I'm not exactly sure of the course of this area although the accompanying person seems to think it is some better. 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 I reviewed an extensive amount of lab work that came with her on her referral notes from 07/27/16 she had a normal comprehensive metabolic panel. Normal CBC and differential normal urinalysis TSH free T4 and free T3 all normal. 08/29/16; the arm is perhaps somewhat better although only a little bit. Not as much change as I might of like to seen if my initial thought about tinea corpus was correct 09/05/2016 - overall area to the LFA appears improved with one primary ulcer and no observable satellite lesions or areas of drainage. She denies any irching and/or scratching to LFA 09/12/16; no major change in this area still an irregular raised area on the left forearm. Topical antifungal cream does not seem to have had much difference. There is now an open area with a wound in the inferior part of this which according to our nurse was not there last week. Shave biopsies o2 done 09-19-16 Mr. Helfand presents for follow-up on her left forearm  ulceration. She is accompanied by facility staff. Biopsies taken from last week came back as basal cell carcinoma. The pathology report was discussed with her primary wound care provider, Dr. Dellia [name], prior to today. [name], Jillian Geniya G. ([id number]) Objective Constitutional BP within normal limits. afebrile. Vitals Time Taken: 10:59 AM, Height: 62 in, Weight: 167.1 lbs, BMI: 30.6, Temperature: 98.3 F, Pulse: 75 bpm, Respiratory Rate: 16 breaths/min, Blood Pressure: 121/78 mmHg. Respiratory non-labored respiratory effort. Musculoskeletal ambulated without assistance; steady gait. Psychiatric calm, pleasant, conversive. General Notes: discussion had with patient and facility staff regarding Dr. Janalyn Rouse request  that she be seen by [facility] in [place]. The facility staff accompanies Mr. Metzgar today is also the staff responsible for scheduling appointments. She was instructed to call the surgical center to schedule an appointment if she has not heard from them by next Wednesday or Thursday. Integumentary (Hair, Skin) left forearm ulceration appears dry, no new or extending lesions noted. no induration, no fluctuance, denies pain. Wound #1 status is Open. Original cause of wound was Gradually Appeared. The wound is located on the Left,Posterior Forearm. The wound measures 2.9cm length x 2.3cm width x 0.1cm depth; 5.239cm^2 area and 0.524cm^3 volume. The wound is limited to skin breakdown. There is no tunneling or undermining noted. There is a medium amount of sanguinous drainage noted. The wound margin is distinct with the outline attached to the wound base. There is medium (34-66%) pink granulation within the wound bed. There is a medium (34-66%) amount of necrotic tissue within the wound bed including Eschar and Adherent Slough. The periwound skin appearance exhibited: Moist. The periwound skin appearance did not exhibit: Callus, Crepitus, Excoriation, Fluctuance, Friable,  Induration, Localized Edema, Rash, Scarring, Dry/Scaly, Maceration, Atrophie Blanche, Cyanosis, Ecchymosis, Hemosiderin Staining, Mottled, Pallor, Rubor, Erythema. Periwound temperature was noted as No Abnormality. The periwound has tenderness on palpation. Assessment Active Problems [name], Jillian G. ([id number]) ICD-10 C44.619 - Basal cell carcinoma of skin of left upper limb, including shoulder M33.10 - Other dermatomyositis, organ involvement unspecified Plan Wound Cleansing: Wound #1 Left,Posterior Forearm: Clean wound with Normal Saline. Cleanse wound with mild soap and water Secondary Dressing: Wound #1 Left,Posterior Forearm: Boardered Foam Dressing Dressing Change Frequency: Wound #1 Left,Posterior Forearm: Three times weekly Follow-up Appointments: Wound #1 Left,Posterior Forearm: Return Appointment in 2 Jillian. Additional Orders / Instructions: Wound #1 Left,Posterior Forearm: Increase protein intake. Home Health: Wound #1 Left,Posterior Forearm: [place] Nurse may visit PRN to address patient s wound care needs. FACE TO FACE ENCOUNTER: MEDICARE and MEDICAID PATIENTS: I certify that this patient is under my care and that I had a face-to-face encounter that meets the physician face-to-face encounter requirements with this patient on this date. The encounter with the patient was in whole or in part for the following MEDICAL CONDITION: (primary reason for [place]) MEDICAL NECESSITY: I certify, that based on my findings, NURSING services are a medically necessary home health service. HOME BOUND STATUS: I certify that my clinical findings support that this patient is homebound (i.e., Due to illness or injury, pt requires aid of supportive devices such as crutches, cane, wheelchairs, walkers, the use of special transportation or the assistance of another person to leave their place of residence. There is a normal inability to leave the  home and doing so requires considerable and taxing effort. Other absences are for medical reasons / religious services and are infrequent or of short duration when for other reasons). Please direct any NON-WOUND related issues/requests for orders to patient's Primary Care Physician Consults ordered were: Dermatology - for surgical removal of skin cancer Follow-Up Appointments: A follow-up appointment should be scheduled. [name], [name] ([id number]) A Patient Clinical Summary of Care was provided to CD 1. Follow-up with the surgical center in [facility] for excision of left forearm basal cell carcinoma 2. Will cover with foam border for protection as patient has a history of scratching 3. Will schedule a follow-up in 2 Jillian, primarily for continue of care and will discharge at that time if services are not indicated Electronic Signature(s) Signed: 09/19/2016 1:37:00 PM  ByRene Kocher, NP, [name] By: Jillian Kocher, NP, Joseph Johns on 09/19/2016 13:37:00 [name], [name] ([id number]) -------------------------------------------------------------------------------- SuperBill Details Patient Name: [name] Date of Service: 09/19/2016 Medical Record Number: [id number] Patient Account Number: [id number] Date of Birth/Sex: [birth year] (75 y.o. Female) Treating RN: [name] Primary Care Physician: [name] Other Clinician: Referring Physician: Fulton [name] Treating Physician/Extender: Cathie Olden in [name]: 4 Diagnosis Coding ICD-10 Codes Code Description [id number] Basal cell carcinoma of skin of left upper limb, including shoulder M33.10 Other dermatomyositis, organ involvement unspecified Facility Procedures CPT4 Code: [id number] Description: [phone] - WOUND CARE VISIT-LEV 2 EST PT Modifier: Quantity: 1 Physician Procedures CPT4 Code Description: [id number] - WC PHYS LEVEL 3 - EST PT ICD-10 Description Diagnosis C44.619 Basal cell carcinoma of skin of left  upper limb, inclu Modifier: ding shoulde Quantity: 1 r Electronic Signature(s) Signed: 09/19/2016 1:37:21 PM By: Jillian Kocher, NP, Taavi Hoose Entered By: Jillian Kocher, NP, Bailyn Spackman on 09/19/2016 13:37:21

## 2016-09-26 ENCOUNTER — Ambulatory Visit: Payer: Medicare PPO | Admitting: Internal Medicine

## 2016-10-03 ENCOUNTER — Encounter: Payer: Medicare PPO | Attending: Internal Medicine | Admitting: Internal Medicine

## 2016-10-03 DIAGNOSIS — C44612 Basal cell carcinoma of skin of right upper limb, including shoulder: Secondary | ICD-10-CM | POA: Diagnosis not present

## 2016-10-03 DIAGNOSIS — C44619 Basal cell carcinoma of skin of left upper limb, including shoulder: Secondary | ICD-10-CM | POA: Insufficient documentation

## 2016-10-04 NOTE — Progress Notes (Addendum)
[name] ([id number]) Visit Report for 10/03/2016 Biopsy Details Patient Name: [name], [name]. Date of Service: 10/03/2016 12:45 PM Medical Record Patient Account Number: [id number] Number: Treating RN: Montey Hora 01/17/1941 (75 y.o. Other Clinician: Date of Birth/Sex: Female) Treating Lylla Eifler Primary Care Physician/Extender: Bo Merino Physician: Referring Physician: Betsey Holiday in Treatment: 6 Biopsy Performed for: Wound #2 Right Wrist Location(s): Wound Margin Performed By: Physician Ricard Dillon, MD Tissue Punch: No Number of Specimens Taken: 1 Specimen Sent To Pathology: Yes Pre-procedure Verification/Time-Out Yes - 13:18 Taken: Pain Control: Lidocaine Injectable Lidocaine Percent: 2% Instrument: Blade, Forceps Bleeding: Moderate Hemostasis Achieved: Silver Nitrate Procedural Pain: 0 Post Procedural Pain: 0 Response to Treatment: Procedure was tolerated well Post Procedure Diagnosis Same as Pre-procedure Electronic Signature(s) Signed: 10/03/2016 4:39:43 PM By: Linton Ham MD Entered By: Linton Ham on 10/03/2016 13:51:22 [name], [name] ([id number]) -------------------------------------------------------------------------------- Chief Complaint Document Details Patient Name: [name]. Date of Service: 10/03/2016 12:45 PM Medical Record Patient Account Number: [id number] Number: Treating RN: Montey Hora 08/27/1941 (75 y.o. Other Clinician: Date of Birth/Sex: Female) Treating Early Steel Primary Care Physician/Extender: Bo Merino Physician: Referring Physician: Betsey Holiday in Treatment: 6 Information Obtained from: Patient Chief Complaint patient presents for evaluation od wound to left arm. Electronic Signature(s) Signed: 10/03/2016 4:39:43 PM By: Linton Ham MD Entered By: Linton Ham on 10/03/2016 13:51:36 [name], [name]  ([id number]) -------------------------------------------------------------------------------- HPI Details Patient Name: [name] Date of Service: 10/03/2016 12:45 PM Medical Record Patient Account Number: [id number] Number: Treating RN: Montey Hora 1941/06/27 (75 y.o. Other Clinician: Date of Birth/Sex: Female) Treating Javoni Lucken Primary Care Physician/Extender: Bo Merino Physician: Referring Physician: Betsey Holiday in Treatment: 6 History of Present Illness HPI Description: 08/21/16; his is a very pleasant 75 year old woman who lives at a local assisted living. She is accompanied today by the transportation person from her facility. She is here for a skin rash and an open area on her left dorsal arm. The history behind this is vague apparently she has been scratching at this area and rubbing at for at least a month perhaps longer to than that. It was brought to the attention of Dr. Doy Hutching who is the [facility] and he gave her topical Bactroban as well as an oral antibiotic and apparently there has been some improvement although the area is still open. The facility is supposed to be covering this was something to prevent the scratching although it is not clear that that is actually been taking place. I'm not exactly sure of the course of this area although the accompanying person seems to think it is some better. 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 I reviewed an extensive amount of lab work that came with her on her referral notes from 07/27/16 she had a normal comprehensive metabolic panel. Normal CBC and differential normal urinalysis TSH free T4 and free T3 all normal. 08/29/16; the arm is perhaps somewhat better  although only a little bit. Not as much change as I might of like to seen if my initial thought about tinea corpus was correct 09/05/2016 - overall area to the LFA appears improved with one primary ulcer and no observable satellite lesions or areas of drainage. She denies any irching and/or scratching to  LFA 09/12/16; no major change in this area still an irregular raised area on the left forearm. Topical antifungal cream does not seem to have had much difference. There is now an open area with a wound in the inferior part of this which according to our nurse was not there last week. Shave biopsies o2 done 09-19-16 Mr. Briguglio presents for follow-up on her left forearm ulceration. She is accompanied by facility staff. Biopsies taken from last week came back as basal cell carcinoma. The pathology report was discussed with her primary wound care provider, Dr. Dellia Nims, prior to today. 10/03/16; the patient's biopsy I did on the left forearm showed a nodular basal cell carcinoma with reasonably deep margins. She has an appointment in the skin surgery Center on January 13. In the meantime the patient's accompanying staff member 0.7 area on the dorsal right wrist. This hadn't [place] reminiscence to the left arm lesion although not as large and not as pigmented. I went ahead and biopsy this as well Electronic Signature(s) Signed: 10/03/2016 4:39:43 PM By: Linton Ham MD [name], [name] ([id number]) Entered By: Linton Ham on 10/03/2016 13:52:43 [name], [name] ([id number]) -------------------------------------------------------------------------------- Physical Exam Details Patient Name: [name], [name]. Date of Service: 10/03/2016 12:45 PM Medical Record Patient Account Number: [id number] Number: Treating RN: Montey Hora [birth year] (75 y.o. Other Clinician: Date of Birth/Sex: Female) Treating Lucresha Dismuke Primary Care Physician/Extender: Bo Merino Physician: Referring Physician: Betsey Holiday in Treatment: 6 Constitutional Sitting or standing Blood Pressure is within target range for patient.. Pulse regular and within target range for patient.Marland Kitchen Respirations regular, non-labored and within target range.. Temperature is normal and within the target range for the patient.. Patient's appearance is neat and clean. Appears in no acute distress. Well nourished and well developed.. Lymphatic None palpable in the epitrochlear or axillary areas. Notes Wound exam; the area on the left forearm is evolving somewhat some islands of epithelialization. This will need a deep surgical excision after which will need wound care oShe had a new area on the right dorsal wrist somewhat inconspicuous part of it covered in a hyperkeratotic tissue. Some nodule or parts of it. Otherwise macular in nature. No evidence of infection. It resembled the area on the left forearm. A biopsy was done with a scalpel and pickups Electronic Signature(s) Signed: 10/03/2016 4:39:43 PM By: Linton Ham MD Entered By: Linton Ham on 10/03/2016 13:54:33 [name], [name] ([id number]) -------------------------------------------------------------------------------- Physician Orders Details Patient Name: [name] Date of Service: 10/03/2016 12:45 PM Medical Record Patient Account Number: [id number] Number: Treating RN: Montey Hora 1941/09/16 (75 y.o. Other Clinician: Date of Birth/Sex: Female) Treating Ji Feldner Primary Care Physician/Extender: Bo Merino Physician: Referring Physician: Betsey Holiday in Treatment: 6 Verbal / Phone Orders: Yes Clinician: Montey Hora Read Back and Verified: Yes Diagnosis Coding Wound Cleansing Wound #1 Left,Posterior Forearm o Clean wound with Normal Saline. o Cleanse wound with mild soap and water Primary Wound Dressing Wound #1 Left,Posterior Forearm o  Non-adherent pad Secondary Dressing Wound #1 Left,Posterior Forearm o Boardered Foam Dressing - secure with netting Wound #2 Right Wrist o Other - coverlett Dressing Change Frequency Wound #1 Left,Posterior Forearm o Three times weekly Wound #2 Right Wrist o Other: - as needed until healed Follow-up Appointments Wound #1 Left,Posterior Forearm o Other: - return after January 10th appointment with [facility] in [place] #2 Right Wrist o Other: - return after January 10th appointment with [facility] in [place] Additional Orders /  Instructions Wound #1 Left,Posterior Forearm o Increase protein intake. Ramyiah, Neyland Natali G. ([id number]) Wound #2 Right Wrist o Increase protein intake. Home Health Wound #1 Left,Posterior Forearm o [place] Nurse may visit PRN to address patientos wound care needs. o FACE TO FACE ENCOUNTER: MEDICARE and MEDICAID PATIENTS: I certify that this patient is under my care and that I had a face-to-face encounter that meets the physician face-to-face encounter requirements with this patient on this date. The encounter with the patient was in whole or in part for the following MEDICAL CONDITION: (primary reason for [place]) MEDICAL NECESSITY: I certify, that based on my findings, NURSING services are a medically necessary home health service. HOME BOUND STATUS: I certify that my clinical findings support that this patient is homebound (i.e., Due to illness or injury, pt requires aid of supportive devices such as crutches, cane, wheelchairs, walkers, the use of special transportation or the assistance of another person to leave their place of residence. There is a normal inability to leave the home and doing so requires considerable and taxing effort. Other absences are for medical reasons / religious services and are infrequent or of short duration when for other reasons). o Please direct  any NON-WOUND related issues/requests for orders to patient's Primary Care Physician Wound #2 Right Wrist o [place] Nurse may visit PRN to address patientos wound care needs. o FACE TO FACE ENCOUNTER: MEDICARE and MEDICAID PATIENTS: I certify that this patient is under my care and that I had a face-to-face encounter that meets the physician face-to-face encounter requirements with this patient on this date. The encounter with the patient was in whole or in part for the following MEDICAL CONDITION: (primary reason for [place]) MEDICAL NECESSITY: I certify, that based on my findings, NURSING services are a medically necessary home health service. HOME BOUND STATUS: I certify that my clinical findings support that this patient is homebound (i.e., Due to illness or injury, pt requires aid of supportive devices such as crutches, cane, wheelchairs, walkers, the use of special transportation or the assistance of another person to leave their place of residence. There is a normal inability to leave the home and doing so requires considerable and taxing effort. Other absences are for medical reasons / religious services and are infrequent or of short duration when for other reasons). o Please direct any NON-WOUND related issues/requests for orders to patient's Primary Care Physician Laboratory o Tissue Pathology biopsy report (PATH) - right wrist oooo LOINC Code: [id number] oooo Convenience Name: Tiss Path Bx report EZMARIAH, BOUYER ([id number]) Electronic Signature(s) Signed: 10/03/2016 4:39:21 PM By: Montey Hora Signed: 10/03/2016 4:39:43 PM By: Linton Ham MD Entered By: Montey Hora on 10/03/2016 13:23:23 Ripple, Merlina Darnell Level ([id number]) -------------------------------------------------------------------------------- Problem List Details Patient Name: Fackrell, [name]. Date of Service: 10/03/2016 12:45 PM Medical Record Patient  Account Number: [id number] Number: Treating RN: Montey Hora Sep 30, 1941 (75 y.o. Other Clinician: Date of Birth/Sex: Female) Treating Chenae Brager Primary Care Physician/Extender: Bo Merino Physician: Referring Physician: Betsey Holiday in Treatment: 6 Active Problems ICD-10 Encounter Code Description Active Date Diagnosis C44.619 Basal cell carcinoma of skin of left upper limb, including 09/19/2016 Yes shoulder C44.612 Basal cell carcinoma of skin of right upper limb, including 10/03/2016 Yes shoulder Inactive Problems ICD-10 Code Description Active Date Inactive Date M33.10 Other dermatomyositis, organ involvement unspecified 08/21/2016 08/21/2016 Resolved Problems ICD-10 Code Description Active Date Resolved Date B35.4 Tinea corporis 08/21/2016 08/21/2016  Electronic Signature(s) Signed: 10/03/2016 4:39:43 PM By: Linton Ham MD Entered By: Linton Ham on 10/03/2016 13:57:54 [name], [name] ([id number]) -------------------------------------------------------------------------------- Progress Note Details Patient Name: [name]. Date of Service: 10/03/2016 12:45 PM Medical Record Patient Account Number: [id number] Number: Treating RN: Montey Hora 1940-12-11 (75 y.o. Other Clinician: Date of Birth/Sex: Female) Treating Raahil Ong Primary Care Physician/Extender: Bo Merino Physician: Referring Physician: Betsey Holiday in Treatment: 6 Subjective Chief Complaint Information obtained from Patient patient presents for evaluation od wound to left arm. History of Present Illness (HPI) 08/21/16; his is a very pleasant 75 year old woman who lives at a local assisted living. She is accompanied today by the transportation person from her facility. She is here for a skin rash and an open area on her left dorsal arm. The history behind this is vague apparently she has been scratching at this area and  rubbing at for at least a month perhaps longer to than that. It was brought to the attention of Dr. Doy Hutching who is the [facility] and he gave her topical Bactroban as well as an oral antibiotic and apparently there has been some improvement although the area is still open. The facility is supposed to be covering this was something to prevent the scratching although it is not clear that that is actually been taking place. I'm not exactly sure of the course of this area although the accompanying person seems to think it is some better. 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 I reviewed an extensive amount of lab work that came with her on her referral notes from 07/27/16 she had a normal comprehensive metabolic panel. Normal CBC and differential normal urinalysis TSH free T4 and free T3 all normal. 08/29/16; the arm is perhaps somewhat better although only a little bit. Not as much change as I might of like to seen if my initial thought about tinea corpus was correct 09/05/2016 - overall area to the LFA appears improved with one primary ulcer and no observable satellite lesions or areas of drainage. She denies any irching and/or scratching to LFA 09/12/16; no major change in this area still an irregular raised area on the left forearm. Topical antifungal cream does not seem to have had much difference. There is now an open area with a wound in the inferior part of this which according to our nurse was not there last week. Shave biopsies o2 done 09-19-16 Mr. Lefever presents for follow-up on her left forearm ulceration. She is accompanied by facility staff. Biopsies taken from last week came back as basal cell carcinoma. The pathology report was discussed with her primary  wound care provider, Dr. Dellia Nims, prior to today. 10/03/16; the patient's biopsy I did on the left forearm showed a nodular basal cell carcinoma with reasonably deep margins. She has an appointment in the skin surgery Center on January 13. In the meantime the patient's accompanying staff member 0.7 area on the dorsal right wrist. This hadn't [name], [name]. ([id number]) reminiscence to the left arm lesion although not as large and not as pigmented. I went ahead and biopsy this as well Objective Constitutional Sitting or standing Blood Pressure is within target range for patient.. Pulse regular and within target range for patient.Marland Kitchen Respirations regular, non-labored and within target range.. Temperature is  normal and within the target range for the patient.. Patient's appearance is neat and clean. Appears in no acute distress. Well nourished and well developed.. Vitals Time Taken: 12:57 PM, Height: 62 in, Weight: 167.1 lbs, BMI: 30.6, Temperature: 98.1 F, Pulse: 71 bpm, Respiratory Rate: 16 breaths/min, Blood Pressure: 141/65 mmHg. Lymphatic None palpable in the epitrochlear or axillary areas. General Notes: Wound exam; the area on the left forearm is evolving somewhat some islands of epithelialization. This will need a deep surgical excision after which will need wound care She had a new area on the right dorsal wrist somewhat inconspicuous part of it covered in a hyperkeratotic tissue. Some nodule or parts of it. Otherwise macular in nature. No evidence of infection. It resembled the area on the left forearm. A biopsy was done with a scalpel and pickups Integumentary (Hair, Skin) Wound #1 status is Open. Original cause of wound was Gradually Appeared. The wound is located on the Left,Posterior Forearm. The wound measures 4.5cm length x 3.3cm width x 0.1cm depth; 11.663cm^2 area and 1.166cm^3 volume. The wound is limited to skin breakdown. There is no tunneling or undermining noted.  There is a medium amount of sanguinous drainage noted. The wound margin is distinct with the outline attached to the wound base. There is large (67-100%) pink granulation within the wound bed. There is a small (1-33%) amount of necrotic tissue within the wound bed including Eschar and Adherent Slough. The periwound skin appearance exhibited: Moist. The periwound skin appearance did not exhibit: Callus, Crepitus, Excoriation, Fluctuance, Friable, Induration, Localized Edema, Rash, Scarring, Dry/Scaly, Maceration, Atrophie Blanche, Cyanosis, Ecchymosis, Hemosiderin Staining, Mottled, Pallor, Rubor, Erythema. Periwound temperature was noted as No Abnormality. The periwound has tenderness on palpation. Wound #2 status is Open. Original cause of wound was Gradually Appeared. The wound is located on the Right Wrist. The wound measures 0.2cm length x 0.2cm width x 0.1cm depth; 0.031cm^2 area and 0.003cm^3 volume. [name], [name] ([id number]) Assessment Active Problems ICD-10 C44.619 - Basal cell carcinoma of skin of left upper limb, including shoulder C44.612 - Basal cell carcinoma of skin of right upper limb, including shoulder Procedures Wound #2 Wound #2 is an Open Surgical Wound located on the Right Wrist . There was a biopsy performed by Ricard Dillon, MD. There was a biopsy performed on Wound Margin. The skin was cleansed and prepped with anti-septic followed by pain control using Lidocaine Injectable: 2%. Tissue was removed at its base with the following instrument(s): Blade and Forceps and sent to pathology. A Moderate amount of bleeding was controlled with Silver Nitrate. A time out was conducted at 13:18, prior to the start of the procedure. The procedure was tolerated well with a pain level of 0 throughout and a pain level of 0 following the procedure. Post procedure Diagnosis Wound #2: Same as Pre-Procedure Plan Wound Cleansing: Wound #1 Left,Posterior Forearm: Clean wound  with Normal Saline. Cleanse wound with mild soap and water Primary Wound Dressing: Wound #1 Left,Posterior Forearm: Non-adherent pad Secondary Dressing: Wound #1 Left,Posterior Forearm: Boardered Foam Dressing - secure with netting Wound #2 Right Wrist: Other - coverlett Dressing Change Frequency: Wound #1 Left,Posterior Forearm: Three times weekly Wound #2 Right Wrist: Other: - as needed until healed Follow-up Appointments: [name], [name] ([id number]) Wound #1 Left,Posterior Forearm: Other: - return after January 10th appointment with [facility] in [place] #2 Right Wrist: Other: - return after January 10th appointment with [facility] in [place] Additional Orders / Instructions: Wound #1 Left,Posterior Forearm: Increase  protein intake. Wound #2 Right Wrist: Increase protein intake. Home Health: Wound #1 Left,Posterior Forearm: [place] Nurse may visit PRN to address patient s wound care needs. FACE TO FACE ENCOUNTER: MEDICARE and MEDICAID PATIENTS: I certify that this patient is under my care and that I had a face-to-face encounter that meets the physician face-to-face encounter requirements with this patient on this date. The encounter with the patient was in whole or in part for the following MEDICAL CONDITION: (primary reason for [place]) MEDICAL NECESSITY: I certify, that based on my findings, NURSING services are a medically necessary home health service. HOME BOUND STATUS: I certify that my clinical findings support that this patient is homebound (i.e., Due to illness or injury, pt requires aid of supportive devices such as crutches, cane, wheelchairs, walkers, the use of special transportation or the assistance of another person to leave their place of residence. There is a normal inability to leave the home and doing so requires considerable and taxing effort. Other absences are for medical reasons / religious  services and are infrequent or of short duration when for other reasons). Please direct any NON-WOUND related issues/requests for orders to patient's Primary Care Physician Wound #2 Right Wrist: [place] Nurse may visit PRN to address patient s wound care needs. FACE TO FACE ENCOUNTER: MEDICARE and MEDICAID PATIENTS: I certify that this patient is under my care and that I had a face-to-face encounter that meets the physician face-to-face encounter requirements with this patient on this date. The encounter with the patient was in whole or in part for the following MEDICAL CONDITION: (primary reason for [place]) MEDICAL NECESSITY: I certify, that based on my findings, NURSING services are a medically necessary home health service. HOME BOUND STATUS: I certify that my clinical findings support that this patient is homebound (i.e., Due to illness or injury, pt requires aid of supportive devices such as crutches, cane, wheelchairs, walkers, the use of special transportation or the assistance of another person to leave their place of residence. There is a normal inability to leave the home and doing so requires considerable and taxing effort. Other absences are for medical reasons / religious services and are infrequent or of short duration when for other reasons). Please direct any NON-WOUND related issues/requests for orders to patient's Primary Care Physician Laboratory ordered were: [facility] Path Bx report - right wrist There is really no reason for this patient come back here until after her skin surgery in January. Appointment is on January 13. We did a biopsy of the right wrist if this is positive we will call and report this to her caregivers or her family but she would need another procedure. MAYSON, GAVIN ([id number]) Electronic Signature(s) Signed: 10/04/2016 7:49:56 AM By: Gretta Cool RN, BSN, Kim RN, BSN Signed: 10/05/2016 2:36:16 AM By: Linton Ham  MD Previous Signature: 10/03/2016 4:39:43 PM Version By: Linton Ham MD Entered By: Gretta Cool RN, BSN, Kim on 10/04/2016 07:49:56 Ruddell, [name] ([id number]) -------------------------------------------------------------------------------- [place] Details Patient Name: [name], [name]. Date of Service: 10/03/2016 Medical Record Patient Account Number: [id number] Number: Treating RN: Montey Hora 03-25-1941 (75 y.o. Other Clinician: Date of Birth/Sex: Female) Treating Harless Molinari Primary Care Physician/Extender: Bo Merino Physician: Suella [place] in Treatment: 6 Referring Physician: Fulton Reek Diagnosis Coding ICD-10 Codes Code Description [phone] Basal cell carcinoma of skin of left upper limb, including shoulder C44.612 Basal cell carcinoma of skin of right upper limb, including shoulder Facility  Procedures CPT4 Code Description: [id number] - BIOPSY SKIN-ONE ICD-10 Description Diagnosis C44.612 Basal cell carcinoma of skin of right upper limb, Modifier: including should Quantity: 1 er Physician Procedures CPT4 Code Description: [id number] - WC PHYS BIOPSY-SKIN ONE ICD-10 Description Diagnosis C44.612 Basal cell carcinoma of skin of right upper limb, incl Modifier: uding should Quantity: 1 er Engineer, maintenance) Signed: 10/03/2016 4:39:43 PM By: Linton Ham MD Entered By: Linton Ham on 10/03/2016 13:58:22

## 2016-10-04 NOTE — Progress Notes (Signed)
[name], [name] ([id number]) Visit Report for 10/03/2016 Arrival Information Details Patient Name: [name], [name]. Date of Service: 10/03/2016 12:45 PM Medical Record Patient Account Number: [id number] Number: Treating RN: Montey Hora 22-Nov-1940 (75 y.o. Other Clinician: Date of Birth/Sex: Female) Treating ROBSON, MICHAEL Primary Care Physician: Fulton Reek Physician/Extender: G Referring Physician: Betsey Holiday in Treatment: 6 Visit Information History Since Last Visit Added or deleted any medications: No Patient Arrived: Walker Any new allergies or adverse reactions: No Arrival Time: 12:53 Had a fall or experienced change in No Accompanied By: self activities of daily living that may affect Transfer Assistance: None risk of falls: Patient Identification Verified: Yes Signs or symptoms of abuse/neglect since last No Secondary Verification Process Completed: Yes visito Patient Requires Transmission-Based No Hospitalized since last visit: No Precautions: Pain Present Now: No Patient Has Alerts: No Electronic Signature(s) Signed: 10/03/2016 4:39:21 PM By: Montey Hora Entered By: Montey Hora on 10/03/2016 12:56:23 [name], [name] ([id number]) -------------------------------------------------------------------------------- Encounter Discharge Information Details Patient Name: [name] Date of Service: 10/03/2016 12:45 PM Medical Record Patient Account Number: [id number] Number: Treating RN: Montey Hora 1941/08/23 (75 y.o. Other Clinician: Date of Birth/Sex: Female) Treating ROBSON, MICHAEL Primary Care Physician: Fulton Reek Physician/Extender: G Referring Physician: Betsey Holiday in Treatment: 6 Encounter Discharge Information Items Discharge Pain [name]: 0 Discharge Condition: Stable Ambulatory Status: Walker Discharge Destination: Home Transportation: Private Auto Accompanied By: staff Schedule  Follow-up Appointment: Yes Medication Reconciliation completed No and provided to Patient/Care [name]: Provided on Clinical Summary of Care: 10/03/2016 Form Type Recipient Paper Patient CD Electronic Signature(s) Signed: 10/03/2016 4:39:21 PM By: Montey Hora Previous Signature: 10/03/2016 1:38:22 PM Version By: Ruthine Dose Entered By: Montey Hora on 10/03/2016 15:41:20 Chacko, [name] ([id number]) -------------------------------------------------------------------------------- Multi Wound Chart Details Patient Name: [name]. Date of Service: 10/03/2016 12:45 PM Medical Record Patient Account Number: [id number] Number: Treating RN: Montey Hora [birth year] (75 y.o. Other Clinician: Date of Birth/Sex: Female) Treating ROBSON, [place] Primary Care Physician: Fulton Reek Physician/Extender: G Referring Physician: Betsey Holiday in Treatment: 6 Vital Signs Height(in): 62 Pulse(bpm): 71 Weight(lbs): 167.1 Blood Pressure 141/65 (mmHg): Body Mass Index(BMI): 31 Temperature(F): 98.1 Respiratory Rate 16 (breaths/min): Photos: [N/A:N/A] Wound Location: Left Forearm - Posterior N/A N/A Wounding Event: Gradually Appeared N/A N/A Primary Etiology: Malignant Wound N/A N/A Comorbid History: Anemia, Hypertension, N/A N/A Phlebitis, Neuropathy Date Acquired: 06/21/2016 N/A N/A Weeks of Treatment: 6 N/A N/A Wound Status: Open N/A N/A Measurements L x W x D 4.5x3.3x0.1 N/A N/A (cm) Area (cm) : 11.663 N/A N/A Volume (cm) : 1.166 N/A N/A % Reduction in Area: -230.00% N/A N/A % Reduction in Volume: -230.30% N/A N/A Classification: Partial Thickness N/A N/A Exudate Amount: Medium N/A N/A Exudate Type: Sanguinous N/A N/A Exudate Color: red N/A N/A Wound Margin: Distinct, outline attached N/A N/A Granulation Amount: Large (67-100%) N/A N/A Granulation Quality: Pink N/A N/A Somma, Jillian G. ([id number]) Necrotic Amount: Small (1-33%) N/A  N/A Necrotic Tissue: Eschar, Adherent Slough N/A N/A Exposed Structures: Fascia: No N/A N/A Fat: No Tendon: No Muscle: No Joint: No Bone: No Limited to Skin Breakdown Epithelialization: Medium (34-66%) N/A N/A Periwound Skin Texture: Edema: No N/A N/A Excoriation: No Induration: No Callus: No Crepitus: No Fluctuance: No Friable: No Rash: No Scarring: No Periwound Skin Moist: Yes N/A N/A Moisture: Maceration: No Dry/Scaly: No Periwound Skin Color: Atrophie Blanche: No N/A N/A Cyanosis: No Ecchymosis: No Erythema: No Hemosiderin Staining: No Mottled: No Pallor: No Rubor: No Temperature: No Abnormality  N/A N/A Tenderness on Yes N/A N/A Palpation: Wound Preparation: Ulcer Cleansing: N/A N/A Rinsed/Irrigated with Saline Topical Anesthetic Applied: None Treatment Notes Electronic Signature(s) Signed: 10/03/2016 4:39:21 PM By: Montey Hora Entered By: Montey Hora on 10/03/2016 13:08:57 Krage, [name] ([id number]) -------------------------------------------------------------------------------- [place] Details Patient Name: [name]. Date of Service: 10/03/2016 12:45 PM Medical Record Patient Account Number: [id number] Number: Treating RN: Montey Hora 11/28/1940 (75 y.o. Other Clinician: Date of Birth/Sex: Female) Treating ROBSON, MICHAEL Primary Care Physician: Fulton Reek Physician/Extender: G Referring Physician: Betsey Holiday in Treatment: 6 Active Inactive Abuse / Safety / Falls / Self Care Management Nursing Diagnoses: Potential for falls Goals: Patient will remain injury free Date Initiated: 08/21/2016 Goal Status: Active Interventions: Assess fall risk on admission and as needed Assess impairment of mobility on admission and as needed per policy Notes: Nutrition Nursing Diagnoses: Imbalanced nutrition Goals: Patient/caregiver agrees to and verbalizes understanding of need to use  nutritional supplements and/or vitamins as prescribed Date Initiated: 08/21/2016 Goal Status: Active Interventions: Assess patient nutrition upon admission and as needed per policy Notes: Orientation to the Wound Care Program Nursing Diagnoses: Knowledge deficit related to the wound healing center program [name], [name] ([id number]) Goals: Patient/caregiver will verbalize understanding of the [place] Date Initiated: 08/21/2016 Goal Status: Active Interventions: Provide education on orientation to the wound center Notes: Wound/Skin Impairment Nursing Diagnoses: Impaired tissue integrity Goals: Ulcer/skin breakdown will have a volume reduction of 30% by week 4 Date Initiated: 08/21/2016 Goal Status: Active Ulcer/skin breakdown will have a volume reduction of 50% by week 8 Date Initiated: 08/21/2016 Goal Status: Active Ulcer/skin breakdown will have a volume reduction of 80% by week 12 Date Initiated: 08/21/2016 Goal Status: Active Interventions: Assess patient/caregiver ability to perform ulcer/skin care regimen upon admission and as needed Assess ulceration(s) every visit Notes: Electronic Signature(s) Signed: 10/03/2016 4:39:21 PM By: Montey Hora Entered By: Montey Hora on 10/03/2016 13:20:45 [name], [name] ([id number]) -------------------------------------------------------------------------------- Pain Assessment Details Patient Name: [name] Date of Service: 10/03/2016 12:45 PM Medical Record Patient Account Number: [id number] Number: Treating RN: Montey Hora 10/27/1941 (75 y.o. Other Clinician: Date of Birth/Sex: Female) Treating ROBSON, [place] Primary Care Physician: Fulton Reek Physician/Extender: G Referring Physician: Betsey Holiday in Treatment: 6 Active Problems Location of Pain Severity and Description of Pain Patient Has Paino No Site Locations Pain Management and Medication Current  Pain Management: Notes Topical or injectable lidocaine is offered to patient for acute pain when surgical debridement is performed. If needed, Patient is instructed to use over the counter pain medication for the following 24-48 hours after debridement. Wound care MDs do not prescribed pain medications. Patient has chronic pain or uncontrolled pain. Patient has been instructed to make an appointment with their Primary Care Physician for pain management. Electronic Signature(s) Signed: 10/03/2016 4:39:21 PM By: Montey Hora Entered By: Montey Hora on 10/03/2016 12:57:35 Krogh, [name] ([id number]) -------------------------------------------------------------------------------- Patient/Caregiver Education Details Patient Name: [name] Date of Service: 10/03/2016 12:45 PM Medical Record Patient Account Number: [id number] Number: Treating RN: Montey Hora 03-29-1941 (75 y.o. Other Clinician: Date of Birth/Gender: Female) Treating ROBSON, [place] Primary Care Physician: Fulton Reek Physician/Extender: G Referring Physician: Betsey Holiday in Treatment: 6 Education Assessment Education Provided To: Patient Education Topics Provided Wound/Skin Impairment: Handouts: Other: wound care as ordered Methods: Demonstration, Explain/Verbal Responses: State content correctly Electronic Signature(s) Signed: 10/03/2016 4:39:21 PM By: Montey Hora Entered By: Montey Hora on 10/03/2016 15:41:34 [name], Jillian G. ([id number]) --------------------------------------------------------------------------------  Wound Assessment Details Patient Name: [name], [place]. Date of Service: 10/03/2016 12:45 PM Medical Record Patient Account Number: [id number] Number: Treating RN: Montey Hora 18-Jul-1941 (75 y.o. 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 - Posterior Wound Status: Open Wounding Event: Gradually Appeared Comorbid Anemia, Hypertension, Phlebitis, Date Acquired: 06/21/2016 History: Neuropathy Weeks Of Treatment: 6 Clustered Wound: No Photos Wound Measurements Length: (cm) 4.5 Width: (cm) 3.3 Depth: (cm) 0.1 Area: (cm) 11.663 Volume: (cm) 1.166 % Reduction in Area: -230% % Reduction in Volume: -230.3% Epithelialization: Medium (34-66%) Tunneling: No Undermining: No Wound Description Classification: Partial Thickness Wound Margin: Distinct, outline attached Exudate Amount: Medium Exudate Type: Sanguinous Exudate Color: red Foul Odor After Cleansing: No Wound Bed Granulation Amount: Large (67-100%) Exposed Structure Granulation Quality: Pink Fascia Exposed: No Necrotic Amount: Small (1-33%) Fat Layer Exposed: No [name], Jillian G. ([id number]) Necrotic Quality: Eschar, Adherent Slough Tendon Exposed: No Muscle Exposed: No Joint Exposed: No Bone Exposed: No Limited to Skin Breakdown Periwound Skin Texture Texture Color No Abnormalities Noted: No No Abnormalities Noted: No Callus: No Atrophie Blanche: No Crepitus: No Cyanosis: No Excoriation: No Ecchymosis: No Fluctuance: No Erythema: No Friable: No Hemosiderin Staining: No Induration: No Mottled: No Localized Edema: No Pallor: No Rash: No Rubor: No Scarring: No Temperature / Pain Moisture Temperature: No Abnormality No Abnormalities Noted: No Tenderness on Palpation: Yes Dry / Scaly: No Maceration: No Moist: Yes Wound Preparation Ulcer Cleansing: Rinsed/Irrigated with Saline Topical Anesthetic Applied: None Treatment Notes Wound #1 (Left, Posterior Forearm) 1. Cleansed with: Clean wound with Normal Saline 4. Dressing Applied: Non-Adherent gauze 5. Secondary Dressing  Applied Bordered Foam Dressing Notes netting Electronic Signature(s) Signed: 10/03/2016 4:39:21 PM By: Montey Hora Entered By: Montey Hora on 10/03/2016 13:08:34 Ritthaler, Ziza Darnell [name] ([id number]) -------------------------------------------------------------------------------- Wound Assessment Details Patient Name: [name], [name]. Date of Service: 10/03/2016 12:45 PM Medical Record Patient Account Number: [id number] Number: Treating RN: Montey Hora Mar 19, 1941 (75 y.o. Other Clinician: Date of Birth/Sex: Female) Treating ROBSON, MICHAEL Primary Care Physician: Fulton Reek Physician/Extender: G Referring Physician: Betsey Holiday in Treatment: 6 Wound Status Wound Number: 2 Primary Etiology: Open Surgical Wound Wound Location: Right Wrist Wound Status: Open Wounding Event: Gradually Appeared Date Acquired: 10/03/2016 Weeks Of Treatment: 0 Clustered Wound: No Wound Measurements Length: (cm) Width: (cm) Depth: (cm) Area: (cm) Volume: (cm) 0.2 % Reduction in Area: 0.2 % Reduction in Volume: 0.1 0.031 0.003 Periwound Skin Texture Texture Color No Abnormalities Noted: No No Abnormalities Noted: No Moisture No Abnormalities Noted: No Treatment Notes Wound #2 (Right Wrist) 2. Anesthetic 2% Lidocaine injectible without epinephrine prior to debridement 4. Dressing Applied: Other dressing (specify in notes) Notes coverlett Electronic Signature(s) Signed: 10/03/2016 4:39:21 PM By: Montey Hora Entered By: Montey Hora on 10/03/2016 13:20:36 Mickel, [name] ([id number]) -------------------------------------------------------------------------------- [place] Details Patient Name: [name] Date of Service: 10/03/2016 12:45 PM Medical Record Patient Account Number: [id number] Number: Treating RN: Montey Hora 07-Sep-1941 (75 y.o. Other Clinician: Date of Birth/Sex: Female) Treating ROBSON, [place] Primary Care  Physician: Fulton Reek Physician/Extender: G Referring Physician: Betsey Holiday in Treatment: 6 Vital Signs Time Taken: 12:57 Temperature (F): 98.1 Height (in): 62 Pulse (bpm): 71 Weight (lbs): 167.1 Respiratory Rate (breaths/min): 16 Body Mass Index (BMI): 30.6 Blood Pressure (mmHg): 141/65 Reference Range: 80 - 120 mg / dl Electronic Signature(s)  Signed: 10/03/2016 4:39:21 PM By: Montey Hora Entered By: Montey Hora on 10/03/2016 13:00:05

## 2016-10-05 LAB — SURGICAL PATHOLOGY

## 2016-10-17 ENCOUNTER — Ambulatory Visit: Payer: Medicare PPO | Admitting: Internal Medicine

## 2016-11-14 ENCOUNTER — Ambulatory Visit: Payer: Medicare PPO | Admitting: Internal Medicine

## 2016-11-20 ENCOUNTER — Encounter: Payer: Medicare PPO | Attending: Internal Medicine | Admitting: Internal Medicine

## 2016-11-20 DIAGNOSIS — I10 Essential (primary) hypertension: Secondary | ICD-10-CM | POA: Diagnosis not present

## 2016-11-20 DIAGNOSIS — G629 Polyneuropathy, unspecified: Secondary | ICD-10-CM | POA: Insufficient documentation

## 2016-11-20 DIAGNOSIS — B354 Tinea corporis: Secondary | ICD-10-CM | POA: Insufficient documentation

## 2016-11-20 DIAGNOSIS — M3310 Other dermatopolymyositis, organ involvement unspecified: Secondary | ICD-10-CM | POA: Insufficient documentation

## 2016-11-21 NOTE — Progress Notes (Signed)
[name], [name] ([id number]) Visit Report for 11/20/2016 Chief Complaint Document Details Patient Name: [name], [name]. Date of Service: 11/20/2016 2:45 PM Medical Record Patient Account Number: [id number] Number: Treating RN: Baruch Gouty, RN, BSN, Rita June 25, 1941 [phone] y.o. Other Clinician: Date of Birth/Sex: Female) Treating Haneef Hallquist Primary Care Provider: Fulton Reek Provider/Extender: G Referring Provider: Betsey Holiday in Treatment: 13 Information Obtained from: Patient Chief Complaint patient presents for evaluation od wound to left arm. Electronic Signature(s) Signed: 11/20/2016 4:44:13 PM By: Linton Ham MD Entered By: Linton Ham on 11/20/2016 15:25:49 Strehlow, [name] ([id number]) -------------------------------------------------------------------------------- HPI Details Patient Name: [name] Date of Service: 11/20/2016 2:45 PM Medical Record Patient Account Number: [id number] Number: Treating RN: Baruch Gouty, RN, BSN, Rita November 25, 1940 [phone] y.o. Other Clinician: Date of Birth/Sex: Female) Treating [name] Primary Care Provider: Fulton Reek Provider/Extender: G Referring Provider: Betsey Holiday in Treatment: 13 History of Present Illness HPI Description: 08/21/16; his is a very pleasant 76 year old woman who lives at a local assisted living. She is accompanied today by the transportation person from her facility. She is here for a skin rash and an open area on her left dorsal arm. The history behind this is vague apparently she has been scratching at this area and rubbing at for at least a month perhaps longer to than that. It was brought to the attention of Dr. Doy Hutching who is the [facility] and he gave her topical Bactroban as well as an oral antibiotic and apparently there has been some improvement although the area is still open. The facility is supposed to be  covering this was something to prevent the scratching although it is not clear that that is actually been taking place. I'm not exactly sure of the course of this area although the accompanying person seems to think it is some better. 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 I reviewed an extensive amount of lab work that came with her on her referral notes from 07/27/16 she had a normal comprehensive metabolic panel. Normal CBC and differential normal urinalysis TSH free T4 and free T3 all normal. 08/29/16; the arm is perhaps somewhat better although only a little bit. Not as much change as I might of like to seen if my initial thought about tinea corpus was correct 09/05/2016 - overall area to the LFA appears improved with one primary ulcer and no observable satellite lesions or areas of drainage. She denies any irching and/or scratching to LFA 09/12/16; no major change in this area still an irregular raised area on the left forearm. Topical antifungal cream does not seem to have had much difference. There is now an open area with a wound in the inferior part of this which according to our nurse was not there last week. Shave biopsies o2 done 09-19-16 Mr. Shepherd presents for follow-up on her left forearm ulceration. She is accompanied by facility staff. Biopsies taken from last week came back as basal cell carcinoma. The pathology report was discussed with her primary wound care provider, Dr. Dellia Nims, prior to today. 10/03/16; the patient's biopsy I did on the left forearm showed a nodular basal cell carcinoma with reasonably deep margins. She has an appointment in the skin surgery Center on January 13. In the meantime the patient's accompanying staff member 0.7 area  on the dorsal right wrist. This haa  a resemblance to the left arm lesion although not as large and not as pigmented. I went ahead and biopsy this as well. 11/21/15; biopsy I did not last time she was here in early December on the right wrist was negative. In the interim she has had surgery on the left arm which is an extensive linear wound with 60 sutures. This appears to be well approximated and well-healed NAKHIYA, GLAZNER ([id number]) Electronic Signature(s) Signed: 11/20/2016 4:44:13 PM By: Linton Ham MD Entered By: Linton Ham on 11/20/2016 15:28:12 Marcellus, [name] ([id number]) -------------------------------------------------------------------------------- Physical Exam Details Patient Name: [name], [name] Date of Service: 11/20/2016 2:45 PM Medical Record Patient Account Number: [id number] Number: Treating RN: Baruch Gouty, RN, BSN, Rita Jul 22, 1941 [phone] y.o. Other Clinician: Date of Birth/Sex: Female) Treating Demetre Monaco Primary Care Provider: Fulton Reek Provider/Extender: G Referring Provider: Betsey Holiday in Treatment: 8 Constitutional Patient is hypertensive.. Pulse regular and within target range for patient.Marland Kitchen Respirations regular, non-labored and within target range.. Temperature is normal and within the target range for the patient.. Patient's appearance is neat and clean. Appears in no acute distress. Well nourished and well developed.Marland Kitchen Respiratory Respiratory effort is easy and symmetric bilaterally. Rate is normal at rest and on room air.Marland Kitchen Lymphatic None palpable in the epitrochlear or axillary area.. Integumentary (Hair, Skin) No additional worrisome areas are seen. The biopsy area on the right wrist I did was negative. For the patient's description the dermatologist also followed up on this. Psychiatric No evidence of depression, anxiety, or agitation. Calm, cooperative, and communicative. Appropriate interactions and affect.. Notes Wound exam; the cancer on the  left arm I'm assuming was excised and then the skin holdover to give a linear wound with 60 sutures. There is no evidence of infection here, the entire surgical site looks well approximated and healthy. Electronic Signature(s) Signed: 11/20/2016 4:44:13 PM By: Linton Ham MD Entered By: Linton Ham on 11/20/2016 15:30:23 Propps, [name] ([id number]) -------------------------------------------------------------------------------- Physician Orders Details Patient Name: [name] Date of Service: 11/20/2016 2:45 PM Medical Record Patient Account Number: [id number] Number: Treating RN: Baruch Gouty, RN, BSN, Rita Dec 13, 1940 [phone] y.o. Other Clinician: Date of Birth/Sex: Female) Treating Krissy Orebaugh Primary Care Provider: Fulton Reek Provider/Extender: G Referring Provider: Betsey Holiday in Treatment: 13 Verbal / Phone Orders: Yes Clinician: Afful, RN, BSN, Velva Harman Read Back and Verified: Yes Diagnosis Coding Discharge From [facility] Services o Discharge from [place] follow up with skin center Electronic Signature(s) Signed: 11/20/2016 3:18:11 PM By: Regan Lemming BSN, RN Signed: 11/20/2016 4:44:13 PM By: Linton Ham MD Entered By: Regan Lemming on 11/20/2016 Berwyn, Peconic. ([id number]) -------------------------------------------------------------------------------- Problem List Details Patient Name: JUDYTHE, PEBLEY. Date of Service: 11/20/2016 2:45 PM Medical Record Patient Account Number: [id number] Number: Treating RN: Baruch Gouty, RN, BSN, Rita 1941-06-18 [phone] y.o. Other Clinician: Date of Birth/Sex: Female) Treating Jiyah Torpey Primary Care Provider: Fulton Reek Provider/Extender: G Referring Provider: Betsey Holiday in Treatment: 13 Active Problems ICD-10 Encounter Code Description Active Date Diagnosis C44.619 Basal cell carcinoma of skin of left upper limb, including 09/19/2016  Yes shoulder C44.612 Basal cell carcinoma of skin of right upper limb, including 10/03/2016 Yes shoulder Inactive Problems ICD-10 Code Description Active Date Inactive Date M33.10 Other dermatomyositis, organ involvement unspecified 08/21/2016 08/21/2016 Resolved Problems ICD-10 Code Description Active Date Resolved Date B35.4 Tinea corporis 08/21/2016 08/21/2016 Electronic Signature(s) Signed: 11/20/2016 4:44:13 PM By: Linton Ham MD Entered By: Dellia Nims,  Freada Twersky on 11/20/2016 15:25:09 Robenson, Kierra Darnell Level ([id number]) -------------------------------------------------------------------------------- Progress Note Details Patient Name: SANAZ, KUCHINSKY. Date of Service: 11/20/2016 2:45 PM Medical Record Patient Account Number: [id number] Number: Treating RN: Baruch Gouty, RN, BSN, Rita 1941/07/27 [phone] y.o. Other Clinician: Date of Birth/Sex: Female) Treating Mckinze Poirier Primary Care Provider: Fulton Reek Provider/Extender: G Referring Provider: Betsey Holiday in Treatment: 13 Subjective Chief Complaint Information obtained from Patient patient presents for evaluation od wound to left arm. History of Present Illness (HPI) 08/21/16; his is a very pleasant 76 year old woman who lives at a local assisted living. She is accompanied today by the transportation person from her facility. She is here for a skin rash and an open area on her left dorsal arm. The history behind this is vague apparently she has been scratching at this area and rubbing at for at least a month perhaps longer to than that. It was brought to the attention of Dr. Doy Hutching who is the [facility] and he gave her topical Bactroban as well as an oral antibiotic and apparently there has been some improvement although the area is still open. The facility is supposed to be covering this was something to prevent the scratching although it is not clear that that  is actually been taking place. I'm not exactly sure of the course of this area although the accompanying person seems to think it is some better. 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 I reviewed an extensive amount of lab work that came with her on her referral notes from 07/27/16 she had a normal comprehensive metabolic panel. Normal CBC and differential normal urinalysis TSH free T4 and free T3 all normal. 08/29/16; the arm is perhaps somewhat better although only a little bit. Not as much change as I might of like to seen if my initial thought about tinea corpus was correct 09/05/2016 - overall area to the LFA appears improved with one primary ulcer and no observable satellite lesions or areas of drainage. She denies any irching and/or scratching to LFA 09/12/16; no major change in this area still an irregular raised area on the left forearm. Topical antifungal cream does not seem to have had much difference. There is now an open area with a wound in the inferior part of this which according to our nurse was not there last week. Shave biopsies o2 done 09-19-16 Mr. Gemmer presents for follow-up on her left forearm ulceration. She is accompanied by facility staff. Biopsies taken from last week came back as basal cell carcinoma. The pathology report was discussed with her primary wound care provider, Dr. Dellia Nims, prior to today. 10/03/16; the patient's biopsy I did on the left forearm showed a nodular basal cell carcinoma with reasonably deep margins. She has an appointment in the skin surgery Center on January 13. In the meantime the patient's accompanying staff member 0.7 area on the dorsal right wrist. This haa a resemblance to the left arm lesion although not as large and not as pigmented. I went ahead  and biopsy Greening, Marlena G. ([id number]) this as well. 11/21/15; biopsy I did not last time she was here in early December on the right wrist was negative. In the interim she has had surgery on the left arm which is an extensive linear wound with 60 sutures. This appears  to be well approximated and well-healed Objective Constitutional Patient is hypertensive.. Pulse regular and within target range for patient.Marland Kitchen Respirations regular, non-labored and within target range.. Temperature is normal and within the target range for the patient.. Patient's appearance is neat and clean. Appears in no acute distress. Well nourished and well developed.. Vitals Time Taken: 2:57 PM, Height: 62 in, Weight: 167.1 lbs, BMI: 30.6, Temperature: 98.3 F, Pulse: 75 bpm, Respiratory Rate: 16 breaths/min, Blood Pressure: 150/66 mmHg. Respiratory Respiratory effort is easy and symmetric bilaterally. Rate is normal at rest and on room air.Marland Kitchen Lymphatic None palpable in the epitrochlear or axillary area.Marland Kitchen Psychiatric No evidence of depression, anxiety, or agitation. Calm, cooperative, and communicative. Appropriate interactions and affect.. General Notes: Wound exam; the cancer on the left arm I'm assuming was excised and then the skin holdover to give a linear wound with 60 sutures. There is no evidence of infection here, the entire surgical site looks well approximated and healthy. Integumentary (Hair, Skin) No additional worrisome areas are seen. The biopsy area on the right wrist I did was negative. For the patient's description the dermatologist also followed up on this. Wound #1 status is Healed - Epithelialized. Original cause of wound was Gradually Appeared. The wound is located on the Left,Posterior Forearm. The wound measures 0cm length x 0cm width x 0cm depth; 0cm^2 area and 0cm^3 volume. The wound is limited to skin breakdown. There is no tunneling or undermining noted. There is a none present amount of  drainage noted. The wound margin is distinct with the outline attached to the wound base. There is large (67-100%) pink, friable granulation within the wound bed. There is no necrotic tissue within the wound bed. The periwound skin appearance exhibited: Dry/Scaly. The periwound skin appearance did not exhibit: Callus, Crepitus, Excoriation, Induration, Rash, Scarring, Maceration, Atrophie Blanche, Cyanosis, Ecchymosis, Hemosiderin Staining, Mottled, Pallor, Rubor, Erythema. Periwound temperature was noted as No Abnormality. MATHEA, AZLIN ([id number]) General Notes: Left forearm cancer excised...she came in with 60 stitches. Will follow up with Skin surgery center tomoro for stitches removal Wound #2 status is Healed - Epithelialized. Original cause of wound was Gradually Appeared. The wound is located on the Right Wrist. The wound measures 0cm length x 0cm width x 0cm depth; 0cm^2 area and 0cm^3 volume. The wound is limited to skin breakdown. There is no tunneling or undermining noted. There is a none present amount of drainage noted. The wound margin is flat and intact. There is no granulation within the wound bed. There is no necrotic tissue within the wound bed. The periwound skin appearance had no abnormalities noted for texture. The periwound skin appearance had no abnormalities noted for color. The periwound skin appearance exhibited: Dry/Scaly. Assessment Active Problems ICD-10 C44.619 - Basal cell carcinoma of skin of left upper limb, including shoulder C44.612 - Basal cell carcinoma of skin of right upper limb, including shoulder Plan Discharge From [facility] Services: Discharge from [place] follow up with skin center #1 basal cell carcinoma of the left forearm. This has beensuccessfully excised. I don't have her surgical report over the surgical wound appears healthy. There is no need for [place] to follow her here Electronic Signature(s) Signed: 11/20/2016 4:44:13 PM By:  Linton Ham MD Entered By: Linton Ham on 11/20/2016 15:31:23 Luten, [name] ([id number]) -------------------------------------------------------------------------------- SuperBill Details Patient Name: [name] Date of Service: 11/20/2016 Medical Record Patient Account Number: [id number] Number: Treating RN: Baruch Gouty, RN, BSN, Rita May 04, 1941 [phone] y.o. Other Clinician: Date  of Birth/Sex: Female) Treating Sesar Madewell, H[place]ita Primary Care Provider: Fulton Reek Provider/Extender: G Referring Provider: Betsey Holiday in Treatment: 13 Diagnosis Coding ICD-10 Codes Code Description [id number] Basal cell carcinoma of skin of left upper limb, including shoulder C44.612 Basal cell carcinoma of skin of right upper limb, including shoulder Facility Procedures CPT4 Code: [id number] Description: [id number] - WOUND CARE VISIT-LEV 2 EST PT Modifier: Quantity: 1 Physician Procedures CPT4 Code Description: [id number] - WC PHYS LEVEL 2 - EST PT ICD-10 Description Diagnosis C44.619 Basal cell carcinoma of skin of left upper limb, inclu Modifier: ding shoulder Quantity: 1 Electronic Signature(s) Signed: 11/20/2016 4:44:13 PM By: Linton Ham MD Entered By: Linton Ham on 11/20/2016 15:31:49

## 2016-11-21 NOTE — Progress Notes (Signed)
[name], [name] ([id number]) Visit Report for 11/20/2016 Arrival Information Details Patient Name: [name], [name]. Date of Service: 11/20/2016 2:45 PM Medical Record Number: [id number] Patient Account Number: [id number] Date of Birth/Sex: 1941-09-11 (76 y.o. Female) Treating RN: Afful, RN, BSN, [name] Primary Care [name]: [name] Other Clinician: Referring [name]: [name] Treating [name]/Extender: [name] in Treatment: 54 Visit Information History Since Last Visit All ordered tests and consults were completed: No Patient Arrived: [name] Added or deleted any medications: No Arrival Time: 14:55 Any new allergies or adverse reactions: No Accompanied By: caregiver Had a fall or experienced change in No Transfer Assistance: None activities of daily living that may affect Patient Identification Verified: Yes risk of falls: Secondary Verification Process Yes Signs or symptoms of abuse/neglect since last No Completed: visito Patient Requires Transmission-Based No Hospitalized since last visit: No Precautions: Has Dressing in Place as Prescribed: Yes Patient Has Alerts: No Pain Present Now: No Electronic Signature(s) Signed: 11/20/2016 5:02:54 PM By: [name] BSN, RN Entered By: [name] on 11/20/2016 14:57:11 [name], [name] Level ([id number]) -------------------------------------------------------------------------------- Clinic Level of Care Assessment Details Patient Name: [name]. Date of Service: 11/20/2016 2:45 PM Medical Record Number: [id number] Patient Account Number: [id number] Date of Birth/Sex: 07-31-1941 (76 y.o. Female) Treating RN: Afful, RN, BSN, [name] Primary Care [name]: [name] Other Clinician: Referring Florella Mcneese: [name] Treating [name]/Extender: [name] in Treatment: 13 Clinic Level of Care Assessment Items TOOL 4 Quantity Score []  - Use when only an EandM is performed on  FOLLOW-UP visit 0 ASSESSMENTS - Nursing Assessment / Reassessment X - Reassessment of Co-morbidities (includes updates in patient status) 1 10 X - Reassessment of Adherence to Treatment Plan 1 5 ASSESSMENTS - Wound and Skin Assessment / Reassessment X - Simple Wound Assessment / Reassessment - one wound 1 5 []  - Complex Wound Assessment / Reassessment - multiple wounds 0 []  - Dermatologic / Skin Assessment (not related to wound area) 0 ASSESSMENTS - Focused Assessment []  - Circumferential Edema Measurements - multi extremities 0 []  - Nutritional Assessment / Counseling / Intervention 0 []  - Lower Extremity Assessment (monofilament, tuning fork, pulses) 0 []  - Peripheral Arterial Disease Assessment (using hand held doppler) 0 ASSESSMENTS - Ostomy and/or Continence Assessment and Care []  - Incontinence Assessment and Management 0 []  - Ostomy Care Assessment and Management (repouching, etc.) 0 PROCESS - Coordination of Care X - Simple Patient / Family Education for ongoing care 1 15 []  - Complex (extensive) Patient / Family Education for ongoing care 0 []  - Staff obtains Programmer, systems, Records, Test Results / Process Orders 0 []  - Staff telephones HHA, Nursing Homes / Clarify orders / etc 0 []  - Routine Transfer to another Facility (non-emergent condition) 0 [name], Jillian G. ([id number]) []  - Routine Hospital Admission (non-emergent condition) 0 []  - New Admissions / Biomedical engineer / Ordering NPWT, Apligraf, etc. 0 []  - Emergency Hospital Admission (emergent condition) 0 []  - Simple Discharge Coordination 0 []  - Complex (extensive) Discharge Coordination 0 PROCESS - Special Needs []  - Pediatric / Minor Patient Management 0 []  - Isolation Patient Management 0 []  - Hearing / Language / Visual special needs 0 []  - Assessment of Community assistance (transportation, D/C planning, etc.) 0 []  - Additional assistance / Altered mentation 0 []  - Support Surface(s) Assessment (bed,  cushion, seat, etc.) 0 INTERVENTIONS - Wound Cleansing / Measurement []  - Simple Wound Cleansing - one wound 0 []  - Complex Wound Cleansing - multiple wounds 0 X -  Wound Imaging (photographs - any number of wounds) 1 5 []  - Wound Tracing (instead of photographs) 0 []  - Simple Wound Measurement - one wound 0 []  - Complex Wound Measurement - multiple wounds 0 INTERVENTIONS - Wound Dressings []  - Small Wound Dressing one or multiple wounds 0 []  - Medium Wound Dressing one or multiple wounds 0 []  - Large Wound Dressing one or multiple wounds 0 []  - Application of Medications - topical 0 []  - Application of Medications - injection 0 INTERVENTIONS - Miscellaneous []  - External ear exam 0 [name], Jillian G. ([id number]) []  - Specimen Collection (cultures, biopsies, blood, body fluids, etc.) 0 []  - Specimen(s) / Culture(s) sent or taken to Lab for analysis 0 []  - Patient Transfer (multiple staff / [name] Lift / Similar devices) 0 []  - Simple Staple / Suture removal (25 or less) 0 []  - Complex Staple / Suture removal (26 or more) 0 []  - Hypo / Hyperglycemic Management (close monitor of Blood Glucose) 0 []  - Ankle / Brachial Index (ABI) - do not check if billed separately 0 X - Vital Signs 1 5 Has the patient been seen at the hospital within the last three years: Yes Total Score: 45 Level Of Care: New/Established - Level 2 Electronic Signature(s) Signed: 11/20/2016 5:02:54 PM By: [name] BSN, RN Entered By: [name] on 11/20/2016 15:19:07 [name] Level ([id number]) -------------------------------------------------------------------------------- Encounter Discharge Information Details Patient Name: [name] Date of Service: 11/20/2016 2:45 PM Medical Record Number: [id number] Patient Account Number: [id number] Date of Birth/Sex: 26-Dec-1940 (76 y.o. Female) Treating RN: Jillian Gouty, RN, BSN, [name] Primary Care [name]: [name] Other Clinician: Referring [name]: [name] Treating [name]: [name] in Treatment: 13 Encounter Discharge Information Items Discharge Pain Level: 0 Discharge Condition: Stable Ambulatory Status: Walker Discharge Destination: Home Private Transportation: Auto Accompanied By: caregiver Schedule Follow-up Appointment: No Medication Reconciliation completed and No provided to Patient/Care Latysha Thackston: Clinical Summary of Care: Electronic Signature(s) Signed: 11/20/2016 3:25:51 PM By: [name] BSN, RN Entered By: [name] on 11/20/2016 15:25:51 Flud, Neko [name] Level ([id number]) -------------------------------------------------------------------------------- Lower Extremity Assessment Details Patient Name: On, [name]. Date of Service: 11/20/2016 2:45 PM Medical Record Number: [id number] Patient Account Number: [id number] Date of Birth/Sex: 11/20/1940 (76 y.o. Female) Treating RN: Afful, RN, BSN, [employer] Primary Care Allora Bains: [name] Other Clinician: Referring Airica Schwartzkopf: [name] Treating Nicolasa Milbrath/Extender: [name] in Treatment: 13 Electronic Signature(s) Signed: 11/20/2016 5:02:54 PM By: [name] BSN, RN Entered By: [name] on 11/20/2016 14:58:50 Mullen, Alexias [name] Level ([id number]) -------------------------------------------------------------------------------- Multi Wound Chart Details Patient Name: Peaden, [name]. Date of Service: 11/20/2016 2:45 PM Medical Record Number: [id number] Patient Account Number: [id number] Date of Birth/Sex: 1941/03/03 (76 y.o. Female) Treating RN: Jillian Gouty, RN, BSN, [name] Primary Care Linkon Siverson: [name] Other Clinician: Referring Laurent Cargile: [name] Treating Ahmari Garton/Extender: [name] in Treatment: 13 Vital Signs Height(in): 62 Pulse(bpm): 75 Weight(lbs): 167.1 Blood Pressure 150/66 (mmHg): Body Mass Index(BMI): 31 Temperature(F): 98.3 Respiratory Rate 16 (breaths/min): Photos: [1:No  Photos] [2:No Photos] [N/A:N/A] Wound Location: [1:Left Forearm - Posterior] [2:Right Wrist] [N/A:N/A] Wounding Event: [1:Gradually Appeared] [2:Gradually Appeared] [N/A:N/A] Primary Etiology: [1:Malignant Wound] [2:Open Surgical Wound] [N/A:N/A] Comorbid History: [1:Anemia, Hypertension, Phlebitis, Neuropathy] [2:Anemia, Hypertension, Phlebitis, Neuropathy] [N/A:N/A] Date Acquired: [1:06/21/2016] [2:10/03/2016] [N/A:N/A] Weeks of Treatment: [1:13] [2:6] [N/A:N/A] Wound Status: [1:Healed - Epithelialized] [2:Healed - Epithelialized] [N/A:N/A] Measurements L x W x D 0x0x0 [2:0x0x0] [N/A:N/A] (cm) Area (cm) : [1:0] [2:0] [N/A:N/A] Volume (cm) : [1:0] [2:0] [N/A:N/A] %  Reduction in Area: [1:100.00%] [2:100.00%] [N/A:N/A] % Reduction in Volume: 100.00% [2:100.00%] [N/A:N/A] Classification: [1:Partial Thickness] [2:Partial Thickness] [N/A:N/A] Exudate Amount: [1:None Present] [2:None Present] [N/A:N/A] Wound Margin: [1:Distinct, outline attached] [2:Flat and Intact] [N/A:N/A] Granulation Amount: [1:Large (67-100%)] [2:None Present (0%)] [N/A:N/A] Granulation Quality: [1:Pink, Friable] [2:N/A] [N/A:N/A] Necrotic Amount: [1:None Present (0%)] [2:None Present (0%)] [N/A:N/A] Exposed Structures: [1:Fascia: No Fat Layer (Subcutaneous Tissue) Exposed: No Tendon: No Muscle: No Joint: No Bone: No Limited to Skin Breakdown] [2:Fascia: No Fat Layer (Subcutaneous Tissue) Exposed: No Tendon: No Muscle: No Joint: No Bone: No Limited to Skin Breakdown]  [N/A:N/A] Epithelialization: Large (67-100%) Large (67-100%) N/A Periwound Skin Texture: Excoriation: No No Abnormalities Noted N/A Induration: No Callus: No Crepitus: No Rash: No Scarring: No Periwound Skin Dry/Scaly: Yes Dry/Scaly: Yes N/A Moisture: Maceration: No Periwound Skin Color: Atrophie Blanche: No No Abnormalities Noted N/A Cyanosis: No Ecchymosis: No Erythema: No Hemosiderin Staining: No Mottled: No Pallor: No Rubor:  No Temperature: No Abnormality N/A N/A Tenderness on No No N/A Palpation: Wound Preparation: Ulcer Cleansing: Ulcer Cleansing: Not N/A Rinsed/Irrigated with Cleansed: healed Saline Topical Anesthetic Topical Anesthetic Applied: None Applied: None Assessment Notes: 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 [facility] Treatment Notes Electronic Signature(s) Signed: 11/20/2016 4:44:13 PM By: Linton Ham MD Previous Signature: 11/20/2016 3:17:52 PM Version By: [name] BSN, RN Entered By: Linton Ham on 11/20/2016 15:25:22 Gorton, [name] ([id number]) -------------------------------------------------------------------------------- [place] Details Patient Name: [name], [name]. Date of Service: 11/20/2016 2:45 PM Medical Record Number: [id number] Patient Account Number: [id number] Date of Birth/Sex: Apr 19, 1941 (76 y.o. Female) Treating RN: Afful, RN, BSN, [employer] Primary Care Kirat Mezquita: [name] Other Clinician: Referring Delio Slates: [name] Treating Thaddaeus Granja/Extender: [name] in Treatment: 13 Active Inactive Electronic Signature(s) Signed: 11/20/2016 3:17:42 PM By: [name] BSN, RN Entered By: [name] on 11/20/2016 15:17:41 [name], [name] Level ([id number]) -------------------------------------------------------------------------------- Pain Assessment Details Patient Name: [name], [name]. Date of Service: 11/20/2016 2:45 PM Medical Record Number: [id number] Patient Account Number: [id number] Date of Birth/Sex: [birth year] (76 y.o. Female) Treating RN: Jillian Gouty, RN, BSN, [name] Primary Care Rainna Nearhood: [name] Other Clinician: Referring Finbar Nippert: [name] Treating Yarexi Pawlicki/Extender: [name] in Treatment: 13 Active Problems Location of Pain Severity and Description of Pain Patient Has Paino No Site  Locations With Dressing Change: No Pain Management and Medication Current Pain Management: Electronic Signature(s) Signed: 11/20/2016 5:02:54 PM By: [name] BSN, RN Entered By: [name] on 11/20/2016 14:57:21 [name], [name] ([id number]) -------------------------------------------------------------------------------- Patient/Caregiver Education Details Patient Name: [name] Date of Service: 11/20/2016 2:45 PM Medical Record Patient Account Number: [id number] Number: Treating RN: Jillian Gouty, RN, BSN, Rita [birth year] [phone] y.o. Other Clinician: Date of Birth/Gender: Female) Treating ROBSON, MICHAEL Primary Care Physician: [name] Physician/Extender: G Referring Physician: Betsey Holiday in Treatment: 13 Education Assessment Education Provided To: Patient Education Topics Provided Electronic Signature(s) Signed: 11/20/2016 5:02:54 PM By: [name] BSN, RN Entered By: [name] on 11/20/2016 15:26:00 [name], [name] Level ([id number]) -------------------------------------------------------------------------------- Wound Assessment Details Patient Name: [name], [name]. Date of Service: 11/20/2016 2:45 PM Medical Record Number: [id number] Patient Account Number: [id number] Date of Birth/Sex: 1941/09/14 (76 y.o. Female) Treating RN: Jillian Gouty, RN, BSN, [name] Primary Care Kyliana Standen: [name] Other Clinician: Referring Nanci Lakatos: [name] Treating Marlies Ligman/Extender: [name] in Treatment: 13 Wound Status Wound Number: 1 Primary Malignant Wound Etiology: Wound Location: Left Forearm - Posterior Wound Status:  Healed - Epithelialized Wounding Event: Gradually Appeared Comorbid Anemia, Hypertension, Phlebitis, Date Acquired: 06/21/2016 History: Neuropathy Weeks Of Treatment: 13 Clustered Wound: No Photos Photo Uploaded By: [name] on 11/20/2016 15:43:57 Wound Measurements Length: (cm) 0 % Reducti Width: (cm) 0 %  Reducti Depth: (cm) 0 Epithelia Area: (cm) 0 Tunnelin Volume: (cm) 0 Undermin on in Area: 100% on in Volume: 100% lization: Large (67-100%) g: No ing: No Wound Description Classification: Partial Thickness Wound Margin: Distinct, outline attached Exudate Amount: None Present Foul Odor After Cleansing: No Slough/Fibrino No Wound Bed Granulation Amount: Large (67-100%) Exposed Structure Granulation Quality: Pink, Friable Fascia Exposed: No Necrotic Amount: None Present (0%) Fat Layer (Subcutaneous Tissue) Exposed: No Tendon Exposed: No Muscle Exposed: No Joint Exposed: No Garraway, Areal G. 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 [facility] removal Electronic Signature(s) Signed: 11/20/2016 3:14:08 PM By: [name] BSN, RN Entered By: [name] on 11/20/2016 15:14:08 [name], [name] Level ([id number]) -------------------------------------------------------------------------------- Wound Assessment Details Patient Name: [name], [name]. Date of Service: 11/20/2016 2:45 PM Medical Record Number: [id number] Patient Account Number: [id number] Date of Birth/Sex: Mar 17, 1941 (76 y.o. Female) Treating RN: Afful, RN, BSN, [name] Primary Care Makalia Bare: [name] Other Clinician: Referring Mazin Emma: [name] Treating  Codi Kertz/Extender: [name] in Treatment: 13 Wound Status Wound Number: 2 Primary Open Surgical Wound Etiology: Wound Location: Right Wrist Wound Status: Healed - Epithelialized Wounding Event: Gradually Appeared Comorbid Anemia, Hypertension, Phlebitis, Date Acquired: 10/03/2016 History: Neuropathy Weeks Of Treatment: 6 Clustered Wound: No Photos Photo Uploaded By: [name] on 11/20/2016 15:44:22 Wound Measurements Length: (cm) 0 % Reduction in Width: (cm) 0 % Reduction in Depth: (cm) 0 Epithelializat Area: (cm) 0 Tunneling: Volume: (cm) 0 Undermining: Area: 100% Volume: 100% ion: Large (67-100%) No No Wound Description Classification: Partial Thickness Wound Margin: Flat and Intact Exudate Amount: None Present Foul Odor After Cleansing: No Slough/Fibrino No Wound Bed Granulation Amount: None Present (0%) Exposed Structure Necrotic Amount: None Present (0%) Fascia Exposed: No Fat Layer (Subcutaneous Tissue) Exposed: No Tendon Exposed: No Muscle Exposed: No Joint Exposed: No Callejo, Naylah G. ([id number]) Bone Exposed: No Limited to Skin Breakdown Periwound Skin Texture Texture Color No Abnormalities Noted: Yes No Abnormalities Noted: Yes Moisture No Abnormalities Noted: No Dry / Scaly: Yes Wound Preparation Ulcer Cleansing: Not Cleansed: healed, Topical Anesthetic Applied: None Electronic Signature(s) Signed: 11/20/2016 3:15:00 PM By: [name] BSN, RN Entered By: [name] on 11/20/2016 15:15:00 Reali, Kyrra [name] Level ([id number]) -------------------------------------------------------------------------------- Vitals Details Patient Name: [name] Date of Service: 11/20/2016 2:45 PM Medical Record Number: [id number] Patient Account Number: [id number] Date of Birth/Sex: 15-Jun-1941 (76 y.o. Female) Treating RN: Afful, RN, BSN, [place] Primary Care Jonea Bukowski: [name] Other Clinician: Referring Rydan Gulyas: [name] Treating Tashira Torre/Extender: [name] in Treatment: 13 Vital Signs Time Taken: 14:57 Temperature (F): 98.3 Height (in): 62 Pulse (bpm): 75 Weight (lbs): 167.1 Respiratory Rate (breaths/min): 16 Body Mass Index (BMI): 30.6 Blood Pressure (mmHg): 150/66 Reference  Range: 80 - 120 mg / dl Electronic Signature(s) Signed: 11/20/2016 5:02:54 PM By: [name] BSN, RN Entered By: [name] on 11/20/2016 15:06:33

## 2017-04-12 ENCOUNTER — Emergency Department
Admission: EM | Admit: 2017-04-12 | Discharge: 2017-04-12 | Disposition: A | Discharge status: Home / Self Care | Payer: Medicare PPO | Attending: Emergency Medicine | Admitting: Emergency Medicine

## 2017-04-12 ENCOUNTER — Encounter: Payer: Self-pay | Admitting: *Deleted

## 2017-04-12 DIAGNOSIS — K5641 Fecal impaction: Secondary | ICD-10-CM | POA: Insufficient documentation

## 2017-04-12 DIAGNOSIS — Z79899 Other long term (current) drug therapy: Secondary | ICD-10-CM | POA: Insufficient documentation

## 2017-04-12 DIAGNOSIS — Z7982 Long term (current) use of aspirin: Secondary | ICD-10-CM | POA: Diagnosis not present

## 2017-04-12 DIAGNOSIS — K5900 Constipation, unspecified: Secondary | ICD-10-CM | POA: Diagnosis present

## 2017-04-12 MED ORDER — POLYETHYLENE GLYCOL 3350 17 G PO PACK: 17.0000 g | PACK | Freq: Every day | ORAL | 0 refills | Status: DC

## 2017-04-12 NOTE — ED Notes (Signed)
This RN called report to [place] at [facility].

## 2017-04-12 NOTE — ED Notes (Signed)
Pt was successful with having a BM. Pt cleaned by nurse and placed in clean gown and depends. Pt back in bed at this time and family is at bedside.

## 2017-04-12 NOTE — Discharge Instructions (Signed)
Please seek medical attention for any high fevers, chest pain, shortness of breath, change in behavior, persistent vomiting, bloody stool or any other new or concerning symptoms.  

## 2017-04-12 NOTE — ED Provider Notes (Signed)
[facility] Emergency Department Provider Note   ____________________________________________   I have reviewed the triage vital signs and the nursing notes.   HISTORY  Chief Complaint Constipation   History limited by: Dementia   HPI [name] is a 76 y.o. female who presents to the emergency department today because of concerns for possible fecal impaction. The patient arrives via EMS. The patient states she has a history of constipation. Additionally the patient has a history of hemorrhoids and is probably scheduled to have a surgery on her hemorrhoids. Per EMS tonight the nurse tech noticed a large all of stool at her rectum. The patient states she has had pain in her rectum. She denies any fevers or vomiting or abdominal pain.   Past Medical History:  Diagnosis Date  . Bipolar 1 disorder ([place])   . Thyroid disease     There are no active problems to display for this patient.   History reviewed. No pertinent surgical history.  Prior to Admission medications   Medication Sig Start Date End Date Taking? Authorizing Provider  acetaminophen (TYLENOL) 325 MG tablet Take 650 mg by mouth every 4 (four) hours as needed for mild pain, fever or headache.    [provider]  ARIPiprazole (ABILIFY) 2 MG tablet Take 2 mg by mouth daily.    [provider]  aspirin 81 MG chewable tablet Chew 81 mg by mouth daily.    [provider]  Biotin 1000 MCG tablet Take 1,000 mcg by mouth 3 (three) times daily.    [provider]  busPIRone (BUSPAR) 15 MG tablet Take 15 mg by mouth 3 (three) times daily.     [provider]  cholecalciferol (VITAMIN D) 1000 units tablet Take 2,000 Units by mouth daily.    [provider]  docusate sodium (COLACE) 100 MG capsule Take 100 mg by mouth 2 (two) times daily.     [provider]  levothyroxine (SYNTHROID, LEVOTHROID) 88 MCG tablet Take 88 mcg by mouth daily  before breakfast.    [provider]  lisinopril (PRINIVIL,ZESTRIL) 5 MG tablet Take 5 mg by mouth daily.    [provider]  losartan (COZAAR) 100 MG tablet Take 100 mg by mouth daily.    [provider]  megestrol (MEGACE) 40 MG/ML suspension Take 400 mg by mouth daily.     [provider]  Multiple Vitamins-Minerals (MULTIVITAMIN WITH MINERALS) tablet Take 1 tablet by mouth daily.    [provider]  oxyCODONE (OXY IR/ROXICODONE) 5 MG immediate release tablet Take 5 mg by mouth every 4 (four) hours as needed for severe pain.    [provider]  ranitidine (ZANTAC) 150 MG tablet Take 150 mg by mouth 2 (two) times daily.    [provider]  venlafaxine XR (EFFEXOR-XR) 150 MG 24 hr capsule Take 150 mg by mouth daily.    [provider]    Allergies Patient has no known allergies.  History reviewed. No pertinent family history.  Social History Social History  Substance Use Topics  . Smoking status: Never Smoker  . Smokeless tobacco: Never Used  . Alcohol use No    Review of Systems Constitutional: No fever/chills Eyes: No visual changes. ENT: No sore throat. Cardiovascular: Denies chest pain. Respiratory: Denies shortness of breath. Gastrointestinal: Positive for rectal pain. Genitourinary: Negative for dysuria. Musculoskeletal: Negative for back pain. Skin: Negative for rash. Neurological: Negative for headaches, focal weakness or numbness.  ____________________________________________  PHYSICAL EXAM:  VITAL SIGNS: ED Triage Vitals  Enc Vitals Group     BP 04/12/17 2036 (!) 125/93     Pulse Rate 04/12/17 2036 96     Resp 04/12/17 2036 16     Temp 04/12/17 2036 98.6 F (37 C)     Temp Source 04/12/17 2036 Oral     SpO2 04/12/17 2036 95 %     Weight 04/12/17 2036 165 lb (74.8 kg)     Height 04/12/17 2036 5\' 2"  (1.575 m)     Head Circumference --      Peak Flow --      Pain Score 04/12/17 2035  5   Constitutional: Awake and alert. No acute distress.  Eyes: Conjunctivae are normal.  ENT   Head: Normocephalic and atraumatic.   Nose: No congestion/rhinnorhea.   Mouth/Throat: Mucous membranes are moist.   Neck: No stridor. Hematological/Lymphatic/Immunilogical: No cervical lymphadenopathy. Cardiovascular: Normal rate, regular rhythm.  No murmurs, rubs, or gallops.  Respiratory: Normal respiratory effort without tachypnea nor retractions. Breath sounds are clear and equal bilaterally. No wheezes/rales/rhonchi. Gastrointestinal: Soft and non tender. No rebound. No guarding.  Rectal: Large amount of stool in rectal vault consistent with fecal impaction.  Musculoskeletal: Normal range of motion in all extremities. No lower extremity edema. Neurologic:  Normal speech and language. No gross focal neurologic deficits are appreciated.  Skin:  Skin is warm, dry and intact. No rash noted. Psychiatric: Mood and affect are normal. Speech and behavior are normal. Patient exhibits appropriate insight and judgment.  ____________________________________________    LABS (pertinent positives/negatives)  Labs Reviewed - No data to display   ____________________________________________   EKG  None  ____________________________________________    RADIOLOGY  None   ____________________________________________   PROCEDURES  Procedures  ------------------------------------------------------------------------------------------------------------------- Fecal Disimpaction Procedure Note:  Performed by me:  Patient placed in the lateral recumbent position with knees drawn towards chest. Nurse present for patient support. Large amount of hard brown stool removed. No complications during  procedure.   ------------------------------------------------------------------------------------------------------------------   ____________________________________________   INITIAL IMPRESSION / ASSESSMENT AND PLAN / ED COURSE  Pertinent labs & imaging results that were available during my care of the patient were reviewed by me and considered in my medical decision making (see chart for details).  Patient presented to the emergency department today because of concerns for fecal impaction. On exam patient did have a fecal impaction. This was successfully disimpacted. Patient will be discharged home with prescription for Maalox.  ____________________________________________   FINAL CLINICAL IMPRESSION(S) / ED DIAGNOSES  Final diagnoses:  Constipation, unspecified constipation type  Fecal impaction in rectum [facility])     Note: This dictation was prepared with Dragon dictation. Any transcriptional errors that result from this process are unintentional  d   Nance Pear, MD 04/13/17 0010

## 2017-04-12 NOTE — ED Triage Notes (Signed)
Pt to ED from the [facility] reporting increased pain in rectum. PT reports having been at PCP today and was told she would need surgery on hemorrhoids but that there is a mass of stool in the rectum. PT was placed on pain medication and a stool softener that EMS reports pt has not been receiving. Large amount of dark stool removed manually by Dr. Archie Balboa upon arrival to ED.

## 2017-04-12 NOTE — ED Notes (Signed)
Enema has been completed and pt is currently on toilet attempting to have a BM at this time.

## 2018-05-22 ENCOUNTER — Emergency Department
Admission: EM | Admit: 2018-05-22 | Discharge: 2018-05-22 | Disposition: A | Discharge status: Skilled Nursing Facility | Payer: Medicare PPO | Attending: Emergency Medicine | Admitting: Emergency Medicine

## 2018-05-22 ENCOUNTER — Other Ambulatory Visit: Payer: Self-pay

## 2018-05-22 ENCOUNTER — Emergency Department: Payer: Medicare PPO

## 2018-05-22 DIAGNOSIS — W06XXXA Fall from bed, initial encounter: Secondary | ICD-10-CM | POA: Insufficient documentation

## 2018-05-22 DIAGNOSIS — Y929 Unspecified place or not applicable: Secondary | ICD-10-CM | POA: Diagnosis not present

## 2018-05-22 DIAGNOSIS — S0990XA Unspecified injury of head, initial encounter: Secondary | ICD-10-CM | POA: Diagnosis present

## 2018-05-22 DIAGNOSIS — Y999 Unspecified external cause status: Secondary | ICD-10-CM | POA: Insufficient documentation

## 2018-05-22 DIAGNOSIS — Z79899 Other long term (current) drug therapy: Secondary | ICD-10-CM | POA: Diagnosis not present

## 2018-05-22 DIAGNOSIS — Y939 Activity, unspecified: Secondary | ICD-10-CM | POA: Insufficient documentation

## 2018-05-22 DIAGNOSIS — Z7982 Long term (current) use of aspirin: Secondary | ICD-10-CM | POA: Insufficient documentation

## 2018-05-22 DIAGNOSIS — S0011XA Contusion of right eyelid and periocular area, initial encounter: Secondary | ICD-10-CM | POA: Insufficient documentation

## 2018-05-22 LAB — URINALYSIS, ROUTINE W REFLEX MICROSCOPIC
Bilirubin Urine: NEGATIVE
Glucose, UA: NEGATIVE mg/dL
Hgb urine dipstick: NEGATIVE
Ketones, ur: NEGATIVE mg/dL
LEUKOCYTES UA: NEGATIVE
Nitrite: NEGATIVE
PROTEIN: NEGATIVE mg/dL
Specific Gravity, Urine: 1.012 (ref 1.005–1.030)
pH: 5 (ref 5.0–8.0)

## 2018-05-22 NOTE — ED Triage Notes (Signed)
Pt from the [facility] with a "roll out of bed". Pt with slight bruise noted to right lateral eyebrow and complains of intermittent right hip pain. Pt alert to self and place only. Unsure of loc.

## 2018-05-22 NOTE — ED Notes (Signed)
[facility] at bedside for transportation back to facility, discharge paperwork provided

## 2018-05-22 NOTE — ED Notes (Signed)
Ems cancelled for pt. [facility] called and will provide transportation back to facility.

## 2018-05-22 NOTE — ED Provider Notes (Signed)
[facility] Emergency Department Provider Note ___________________   First MD Initiated Contact with Patient 05/22/18 [phone]     (approximate)  I have reviewed the triage vital signs and the nursing notes.   HISTORY  Chief Complaint Fall    HPI [name] is a 77 y.o. female presents to the emergency department via EMS from the [place] after rolling out of bed onto her right hip with striking the right side of her head as well.  Patient admits to no pain at present.  No dizziness nausea or vomiting.   Past Medical History:  Diagnosis Date  . Bipolar 1 disorder ([place])   . Thyroid disease     There are no active problems to display for this patient.  Past surgical history None Prior to Admission medications   Medication Sig Start Date End Date Taking? Authorizing Provider  acetaminophen (TYLENOL) 325 MG tablet Take 650 mg by mouth every 4 (four) hours as needed for mild pain, fever or headache.    [provider]  ARIPiprazole (ABILIFY) 2 MG tablet Take 2 mg by mouth daily.    [provider]  aspirin 81 MG chewable tablet Chew 81 mg by mouth daily.    [provider]  Biotin 1000 MCG tablet Take 1,000 mcg by mouth 3 (three) times daily.    [provider]  busPIRone (BUSPAR) 15 MG tablet Take 15 mg by mouth 3 (three) times daily.     [provider]  cholecalciferol (VITAMIN D) 1000 units tablet Take 2,000 Units by mouth daily.    [provider]  docusate sodium (COLACE) 100 MG capsule Take 100 mg by mouth 2 (two) times daily.     [provider]  levothyroxine (SYNTHROID, LEVOTHROID) 88 MCG tablet Take 88 mcg by mouth daily before breakfast.    [provider]  lisinopril (PRINIVIL,ZESTRIL) 5 MG tablet Take 5 mg by mouth daily.    [provider]  losartan (COZAAR) 100 MG tablet Take 100 mg by mouth daily.    [provider]  megestrol  (MEGACE) 40 MG/ML suspension Take 400 mg by mouth daily.     [provider]  Multiple Vitamins-Minerals (MULTIVITAMIN WITH MINERALS) tablet Take 1 tablet by mouth daily.    [provider]  oxyCODONE (OXY IR/ROXICODONE) 5 MG immediate release tablet Take 5 mg by mouth every 4 (four) hours as needed for severe pain.    [provider]  polyethylene glycol (MIRALAX) packet Take 17 g by mouth daily. 04/12/17   Nance Pear, MD  ranitidine (ZANTAC) 150 MG tablet Take 150 mg by mouth 2 (two) times daily.    [provider]  venlafaxine XR (EFFEXOR-XR) 150 MG 24 hr capsule Take 150 mg by mouth daily.    [provider]    Allergies No known drug allergies No family history on file.  Social History Social History   Tobacco Use  . Smoking status: Never Smoker  . Smokeless tobacco: Never Used  Substance Use Topics  . Alcohol use: No  . Drug use: No    Review of Systems Constitutional: No fever/chills Eyes: No visual changes. ENT: No sore throat. Cardiovascular: Denies chest pain. Respiratory: Denies shortness of breath. Gastrointestinal: No abdominal pain.  No nausea, no vomiting.  No diarrhea.  No constipation. Genitourinary: Negative for dysuria. Musculoskeletal: Negative for neck pain.  Negative for back pain. Integumentary: Negative for rash. Neurological: Negative for headaches, focal weakness  or numbness.   ____________________________________________   PHYSICAL EXAM:  VITAL SIGNS: ED Triage Vitals  Enc Vitals Group     BP 05/22/18 0558 123/87     Pulse Rate 05/22/18 0558 78     Resp 05/22/18 0558 14     Temp 05/22/18 0558 99.1 F (37.3 C)     Temp Source 05/22/18 0558 Oral     SpO2 05/22/18 0558 96 %     Weight 05/22/18 0559 69.1 kg (152 lb 4 oz)     Height 05/22/18 0559 1.6 m (5\' 3" )     Head Circumference --      Peak Flow --      Pain Score --      Pain Loc --      Pain Edu? --      Excl. in [place]? --      Constitutional: Alert and oriented. Well appearing and in no acute distress. Eyes: Conjunctivae are normal.  Head: Right periorbital contusion Ears:  Healthy appearing ear canals and TMs bilaterally Nose: No congestion/rhinnorhea. Mouth/Throat: Mucous membranes are moist.  Oropharynx non-erythematous. Neck: No stridor.  Cardiovascular: Normal rate, regular rhythm. Good peripheral circulation. Grossly normal heart sounds. Respiratory: Normal respiratory effort.  No retractions. Lungs CTAB. Gastrointestinal: Soft and nontender. No distention.  Musculoskeletal: No lower extremity tenderness nor edema. No gross deformities of extremities. Neurologic:  Normal speech and language. No gross focal neurologic deficits are appreciated.  Skin:  Skin is warm, dry and intact. No rash noted. Psychiatric: Mood and affect are normal. Speech and behavior are normal.  ____________________________________________   LABS (all labs ordered are listed, but only abnormal results are displayed)  Labs Reviewed  URINALYSIS, [place], [place], personally viewed and evaluated these images (plain radiographs) as part of my medical decision making, as well as reviewing the written report by the radiologist.  ED MD interpretation: No acute intracranial findings on CT scan of the head  Official radiology report(s): Ct Head Wo Contrast  Result Date: 05/22/2018 CLINICAL DATA:  Fall from bed EXAM: CT HEAD WITHOUT CONTRAST TECHNIQUE: Contiguous axial images were obtained from the base of the skull through the vertex without intravenous contrast. COMPARISON:  Head CT 12/02/2015 FINDINGS: Brain: There is no mass, hemorrhage or extra-axial collection. Old left frontal infarct with ex vacuo dilatation of the left lateral ventricle frontal horn. There is hypoattenuation of the periventricular white matter, most commonly indicating chronic ischemic microangiopathy. Mild  generalized volume loss. Vascular: No abnormal hyperdensity of the major intracranial arteries or dural venous sinuses. No intracranial atherosclerosis. Skull: The visualized skull base, calvarium and extracranial soft tissues are normal. Sinuses/Orbits: No fluid levels or advanced mucosal thickening of the visualized paranasal sinuses. No mastoid or middle ear effusion. The orbits are normal. IMPRESSION: 1. No acute intracranial abnormality. 2. 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 Electronically Signed   By: Ulyses Jarred M.D.   On: 05/22/2018 06:24   Dg Hip Unilat W Or Wo Pelvis 2-3 Views Right  Result Date: 05/22/2018 CLINICAL DATA:  Patient fell from bed with right hip pain. EXAM: DG HIP (WITH OR WITHOUT PELVIS) 2-3V RIGHT COMPARISON:  None. FINDINGS: Postoperative changes with right hip hemiarthroplasty using a long-stem femoral component. The component appears well seated. Old deformity of the sub trochanteric region consistent with old healed fracture. No evidence of acute fracture or dislocation of the right hip. The pelvis appears intact. SI joints and  symphysis pubis are not displaced. Degenerative changes in the lower lumbar spine. IMPRESSION: Right hip hemiarthroplasty. No acute fracture or dislocation. Electronically Signed   By: Lucienne Capers M.D.   On: 05/22/2018 06:33     Procedures   ____________________________________________   INITIAL IMPRESSION / ASSESSMENT AND PLAN / ED COURSE  As part of my medical decision making, I reviewed the following data within the electronic MEDICAL RECORD NUMBER   77 year old female presented with above-stated history and physical exam of rolling out of bed with resultant right periorbital contusion. ____________________________________________  FINAL CLINICAL IMPRESSION(S) / ED DIAGNOSES  Final diagnoses:  Contusion of right periocular region, initial encounter     MEDICATIONS GIVEN DURING THIS VISIT:  Medications -  No data to display   ED Discharge Orders    None       Note:  This document was prepared using Dragon voice recognition software and may include unintentional dictation errors.    Gregor Hams, MD 05/22/18 [id number]

## 2018-05-22 NOTE — ED Notes (Signed)
Pt given cereal and milk.  

## 2018-05-22 NOTE — ED Notes (Signed)
[facility]  CALLED  FOR  TRANSPORT  [facility]  [place] 

## 2019-07-17 ENCOUNTER — Emergency Department
Admission: EM | Admit: 2019-07-17 | Discharge: 2019-07-18 | Disposition: A | Discharge status: Skilled Nursing Facility | Attending: Emergency Medicine | Admitting: Emergency Medicine

## 2019-07-17 ENCOUNTER — Other Ambulatory Visit: Payer: Self-pay

## 2019-07-17 DIAGNOSIS — Z7982 Long term (current) use of aspirin: Secondary | ICD-10-CM | POA: Insufficient documentation

## 2019-07-17 DIAGNOSIS — E860 Dehydration: Secondary | ICD-10-CM | POA: Diagnosis not present

## 2019-07-17 DIAGNOSIS — R42 Dizziness and giddiness: Secondary | ICD-10-CM

## 2019-07-17 DIAGNOSIS — N390 Urinary tract infection, site not specified: Secondary | ICD-10-CM | POA: Diagnosis not present

## 2019-07-17 LAB — HEPATIC FUNCTION PANEL
ALT: 25 U/L (ref 0–44)
AST: 27 U/L (ref 15–41)
Albumin: 3.7 g/dL (ref 3.5–5.0)
Alkaline Phosphatase: 95 U/L (ref 38–126)
Bilirubin, Direct: 0.1 mg/dL (ref 0.0–0.2)
Indirect Bilirubin: 0.3 mg/dL (ref 0.3–0.9)
Total Bilirubin: 0.4 mg/dL (ref 0.3–1.2)
Total Protein: 6.9 g/dL (ref 6.5–8.1)

## 2019-07-17 LAB — CBC
HCT: 39.9 % (ref 36.0–46.0)
Hemoglobin: 12.9 g/dL (ref 12.0–15.0)
MCH: 30.4 pg (ref 26.0–34.0)
MCHC: 32.3 g/dL (ref 30.0–36.0)
MCV: 93.9 fL (ref 80.0–100.0)
Platelets: 259 10*3/uL (ref 150–400)
RBC: 4.25 MIL/uL (ref 3.87–5.11)
RDW: 12.4 % (ref 11.5–15.5)
WBC: 7.9 10*3/uL (ref 4.0–10.5)
nRBC: 0 % (ref 0.0–0.2)

## 2019-07-17 LAB — BASIC METABOLIC PANEL
Anion gap: 11 (ref 5–15)
BUN: 12 mg/dL (ref 8–23)
CO2: 24 mmol/L (ref 22–32)
Calcium: 9.3 mg/dL (ref 8.9–10.3)
Chloride: 102 mmol/L (ref 98–111)
Creatinine, Ser: 0.73 mg/dL (ref 0.44–1.00)
GFR calc Af Amer: >60 mL/min (ref >60)
GFR calc non Af Amer: >60 mL/min (ref >60)
Glucose, Bld: 103 mg/dL — ABNORMAL HIGH (ref 70–99)
Potassium: 3.6 mmol/L (ref 3.5–5.1)
Sodium: 137 mmol/L (ref 135–145)

## 2019-07-17 LAB — T4, FREE: Free T4: 0.99 ng/dL (ref 0.61–1.12)

## 2019-07-17 LAB — TROPONIN I (HIGH SENSITIVITY): Troponin I (High Sensitivity): 3 ng/L (ref <18)

## 2019-07-17 LAB — TSH: TSH: 0.7 u[IU]/mL (ref 0.350–4.500)

## 2019-07-17 MED ORDER — SODIUM CHLORIDE 0.9 % IV BOLUS
1000.0000 mL | Freq: Once | INTRAVENOUS | Status: AC
  Administered 2019-07-17: 1000 mL via INTRAVENOUS

## 2019-07-17 NOTE — Discharge Instructions (Addendum)
Take antibiotic as prescribed (Keflex 250mg three times daily x 7 days). Return to the ER for worsening symptoms, persistent vomiting, difficulty breathing or other concerns. 

## 2019-07-17 NOTE — ED Provider Notes (Signed)
[facility] Emergency Department Provider Note  ____________________________________________   First MD Initiated Contact with Patient 07/17/19 2138     (approximate)  I have reviewed the triage vital signs and the nursing notes.   HISTORY  Chief Complaint Dizziness    HPI [name] is a 78 y.o. female with bipolar 1, thyroid disease who presents with dizziness from [place] patient was orthostatic positive at the facility.  Patient said that she felt slightly dizzy today with standing up.  She denies any dizziness at rest.  The dizziness is intermittent, occurred today, nothing makes it better, nothing makes it worse.  She says she been eating and drinking okay.  Denies any chest pain, shortness of breath, abdominal pain.  Denies any urinary symptoms. No fevers. Pt asks if she can stay here overnight because it is too busy at where she lives and there are too many activities and she just likes to rest.     Past Medical History:  Diagnosis Date  . Bipolar 1 disorder ([place])   . Thyroid disease     There are no active problems to display for this patient.   No past surgical history on file.  Prior to Admission medications   Medication Sig Start Date End Date Taking? Authorizing Provider  acetaminophen (TYLENOL) 325 MG tablet Take 650 mg by mouth every 4 (four) hours as needed for mild pain, fever or headache.    [provider]  ARIPiprazole (ABILIFY) 2 MG tablet Take 2 mg by mouth daily.    [provider]  aspirin 81 MG chewable tablet Chew 81 mg by mouth daily.    [provider]  Biotin 1000 MCG tablet Take 1,000 mcg by mouth 3 (three) times daily.    [provider]  busPIRone (BUSPAR) 15 MG tablet Take 15 mg by mouth 3 (three) times daily.     [provider]  cholecalciferol (VITAMIN D) 1000 units tablet Take 2,000 Units by mouth daily.    [provider]  docusate sodium (COLACE) 100 MG  capsule Take 100 mg by mouth 2 (two) times daily.     [provider]  levothyroxine (SYNTHROID, LEVOTHROID) 88 MCG tablet Take 88 mcg by mouth daily before breakfast.    [provider]  lisinopril (PRINIVIL,ZESTRIL) 5 MG tablet Take 5 mg by mouth daily.    [provider]  losartan (COZAAR) 100 MG tablet Take 100 mg by mouth daily.    [provider]  megestrol (MEGACE) 40 MG/ML suspension Take 400 mg by mouth daily.     [provider]  Multiple Vitamins-Minerals (MULTIVITAMIN WITH MINERALS) tablet Take 1 tablet by mouth daily.    [provider]  oxyCODONE (OXY IR/ROXICODONE) 5 MG immediate release tablet Take 5 mg by mouth every 4 (four) hours as needed for severe pain.    [provider]  polyethylene glycol (MIRALAX) packet Take 17 g by mouth daily. 04/12/17   Nance Pear, MD  ranitidine (ZANTAC) 150 MG tablet Take 150 mg by mouth 2 (two) times daily.    [provider]  venlafaxine XR (EFFEXOR-XR) 150 MG 24 hr capsule Take 150 mg by mouth daily.    [provider]    Allergies Patient has no known allergies.  No family history on file.  Social History Social History   Tobacco Use  . Smoking status: Never Smoker  . Smokeless tobacco: Never Used  Substance Use Topics  . Alcohol use:  No  . Drug use: No      Review of Systems Constitutional: No fever/chills Eyes: No visual changes. ENT: No sore throat. Cardiovascular: Denies chest pain. Respiratory: Denies shortness of breath. Gastrointestinal: No abdominal pain.  No nausea, no vomiting.  No diarrhea.  No constipation. Genitourinary: Negative for dysuria. Musculoskeletal: Negative for back pain. Skin: Negative for rash. Neurological: Negative for headaches, focal weakness or numbness.  Positive dizziness All other ROS negative ____________________________________________   PHYSICAL EXAM:  VITAL SIGNS: ED Triage Vitals [07/17/19  2131]  Enc Vitals Group     BP 130/74     Pulse Rate 65     Resp 16     Temp (!) 97.5 F (36.4 C)     Temp Source Oral     SpO2 100 %     Weight 152 lb 5.4 oz (69.1 kg)     Height 5\' 2"  (1.575 m)     Head Circumference      Peak Flow      Pain Score 0     Pain Loc      Pain Edu?      Excl. in [place]?     Constitutional: Alert and oriented. Well appearing and in no acute distress.  Pleasant elderly female Eyes: Conjunctivae are normal. EOMI. Head: Atraumatic. Nose: No congestion/rhinnorhea. Mouth/Throat: Mucous membranes are moist.   Neck: No stridor. Trachea Midline. FROM Cardiovascular: Normal rate, regular rhythm. Grossly normal heart sounds.  Good peripheral circulation. Respiratory: Normal respiratory effort.  No retractions. Lungs CTAB. Gastrointestinal: Soft and nontender. No distention. No abdominal bruits.  Musculoskeletal: No lower extremity tenderness nor edema.  No joint effusions. Neurologic:  Normal speech and language.  Cranial nerves II through XII look intact.  Equal strength in the arms and legs skin:  Skin is warm, dry and intact. No rash noted. Psychiatric: Mood and affect are normal. Speech and behavior are normal. GU: Deferred   ____________________________________________   LABS (all labs ordered are listed, but only abnormal results are displayed)  Labs Reviewed  BASIC METABOLIC PANEL - Abnormal; Notable for the following components:      Result Value   Glucose, Bld 103 (*)    All other components within normal limits  URINALYSIS, COMPLETE (UACMP) WITH MICROSCOPIC - Abnormal; Notable for the following components:   Color, Urine YELLOW (*)    APPearance HAZY (*)    Leukocytes,Ua LARGE (*)    WBC, UA >50 (*)    Bacteria, UA RARE (*)    All other components within normal limits  URINE CULTURE  CBC  HEPATIC FUNCTION PANEL  TSH  T4, FREE  TROPONIN I (HIGH SENSITIVITY)  TROPONIN I (HIGH SENSITIVITY)   ____________________________________________    ED ECG REPORT I, Vanessa Robert Lee, the attending physician, personally viewed and interpreted this ECG.  EKG is normal sinus rate of 66, no ST elevation, no T wave inversion, normal intervals ____________________________________________  PROCEDURES  Procedure(s) performed (including Critical Care):  Procedures   ____________________________________________   INITIAL IMPRESSION / ASSESSMENT AND PLAN / ED COURSE  [name] was evaluated in Emergency Department on 07/17/2019 for the symptoms described in the history of present illness. She was evaluated in the context of the global COVID-19 pandemic, which necessitated consideration that the patient might be at risk for infection with the SARS-CoV-2 virus that causes COVID-19. Institutional protocols and algorithms that pertain to the evaluation of patients at risk for COVID-19 are in a state of rapid change based  on information released by regulatory bodies including the CDC and federal and state organizations. These policies and algorithms were followed during the patient's care in the ED.    Patient is a well-appearing elderly female with normal vital signs who presented with some dizziness in the setting of being orthostatic positive.  Most likely secondary to dehydration.  We will give 1 L fluid.  Will check labs to evaluate for electrolyte abnormalities, UTI, hypothyroidism, ACS although my suspicion is less likely.  Patient denies any headache to suggest intracranial hemorrhage.  Labs are reassuring with a normal troponin.  Normal electrolytes.  Thyroid studies are normal.    Pt handed off to incoming team pending UA and re-evaluation.  Most likely d/c home after UA as long as blood pressures continue to look well.        ____________________________________________   FINAL CLINICAL IMPRESSION(S) / ED DIAGNOSES   Final diagnoses:  Dehydration  Dizziness  Lower urinary tract infectious disease      MEDICATIONS  GIVEN DURING THIS VISIT:  Medications  cefTRIAXone (ROCEPHIN) 1 g in sodium chloride 0.9 % 100 mL IVPB (1 g Intravenous New Bag/Given 07/18/19 0030)  sodium chloride 0.9 % bolus 1,000 mL (1,000 mLs Intravenous New Bag/Given 07/17/19 2213)     ED Discharge Orders    None       Note:  This document was prepared using Dragon voice recognition software and may include unintentional dictation errors.   Vanessa Dodge, MD 07/18/19 [phone]

## 2019-07-17 NOTE — ED Triage Notes (Signed)
Pt arrived via [facility] from the [facility] with dizziness. Pt had gotten up and felt dizzy and it would not go away. Pt was orthostatic positive at the facility. Pt has hx of AMS.

## 2019-07-18 LAB — URINALYSIS, COMPLETE (UACMP) WITH MICROSCOPIC
Bilirubin Urine: NEGATIVE
Glucose, UA: NEGATIVE mg/dL
Hgb urine dipstick: NEGATIVE
Ketones, ur: NEGATIVE mg/dL
Nitrite: NEGATIVE
Protein, ur: NEGATIVE mg/dL
Specific Gravity, Urine: 1.016 (ref 1.005–1.030)
WBC, UA: >50 WBC/hpf — ABNORMAL HIGH (ref 0–5)
pH: 5 (ref 5.0–8.0)

## 2019-07-18 LAB — TROPONIN I (HIGH SENSITIVITY): Troponin I (High Sensitivity): 4 ng/L (ref <18)

## 2019-07-18 MED ORDER — CEPHALEXIN 250 MG PO CAPS: 250.0000 mg | ORAL_CAPSULE | Freq: Three times a day (TID) | ORAL | 0 refills | Status: DC

## 2019-07-18 MED ORDER — SODIUM CHLORIDE 0.9 % IV SOLN
1.0000 g | Freq: Once | INTRAVENOUS | Status: AC
  Administered 2019-07-18: 01:00:00 1 g via INTRAVENOUS
  Filled 2019-07-18: qty 10

## 2019-07-18 NOTE — ED Notes (Signed)
Pt's son to transport pt back to [facility].

## 2019-07-18 NOTE — ED Provider Notes (Signed)
-----------------------------------------   12:09 AM on 07/18/2019 -----------------------------------------  UA noted. Will administer Rocephin IV. Awaiting repeat troponin.   ----------------------------------------- 1:08 AM on 07/18/2019 -----------------------------------------  Repeat troponin unremarkable. Patient's son picking her up. Strict return precautions given. Patient verbalizes understanding and agrees with plan of care.   Paulette Blanch, MD 07/18/19 [phone]

## 2019-07-19 LAB — URINE CULTURE: Culture: <10000 — AB

## 2020-09-24 ENCOUNTER — Other Ambulatory Visit: Payer: Self-pay

## 2020-09-24 ENCOUNTER — Emergency Department
Admission: EM | Admit: 2020-09-24 | Discharge: 2020-09-24 | Disposition: A | Discharge status: Home / Self Care | Payer: Medicare PPO | Attending: Emergency Medicine | Admitting: Emergency Medicine

## 2020-09-24 ENCOUNTER — Encounter: Payer: Self-pay | Admitting: Emergency Medicine

## 2020-09-24 ENCOUNTER — Emergency Department: Payer: Medicare PPO

## 2020-09-24 DIAGNOSIS — F0390 Unspecified dementia without behavioral disturbance: Secondary | ICD-10-CM | POA: Insufficient documentation

## 2020-09-24 DIAGNOSIS — Z7982 Long term (current) use of aspirin: Secondary | ICD-10-CM | POA: Diagnosis not present

## 2020-09-24 DIAGNOSIS — R82998 Other abnormal findings in urine: Secondary | ICD-10-CM | POA: Insufficient documentation

## 2020-09-24 DIAGNOSIS — R55 Syncope and collapse: Secondary | ICD-10-CM | POA: Diagnosis not present

## 2020-09-24 DIAGNOSIS — R531 Weakness: Secondary | ICD-10-CM | POA: Diagnosis present

## 2020-09-24 LAB — URINALYSIS, COMPLETE (UACMP) WITH MICROSCOPIC
Bilirubin Urine: NEGATIVE
Glucose, UA: NEGATIVE mg/dL
Hgb urine dipstick: NEGATIVE
Ketones, ur: 5 mg/dL — AB
Nitrite: NEGATIVE
Protein, ur: NEGATIVE mg/dL
Specific Gravity, Urine: 1.026 (ref 1.005–1.030)
pH: 5 (ref 5.0–8.0)

## 2020-09-24 LAB — BASIC METABOLIC PANEL
Anion gap: 10 (ref 5–15)
BUN: 17 mg/dL (ref 8–23)
CO2: 24 mmol/L (ref 22–32)
Calcium: 9.1 mg/dL (ref 8.9–10.3)
Chloride: 105 mmol/L (ref 98–111)
Creatinine, Ser: 0.89 mg/dL (ref 0.44–1.00)
GFR, Estimated: >60 mL/min (ref >60)
Glucose, Bld: 122 mg/dL — ABNORMAL HIGH (ref 70–99)
Potassium: 4.1 mmol/L (ref 3.5–5.1)
Sodium: 139 mmol/L (ref 135–145)

## 2020-09-24 LAB — CBC
HCT: 37 % (ref 36.0–46.0)
Hemoglobin: 11.9 g/dL — ABNORMAL LOW (ref 12.0–15.0)
MCH: 30.9 pg (ref 26.0–34.0)
MCHC: 32.2 g/dL (ref 30.0–36.0)
MCV: 96.1 fL (ref 80.0–100.0)
Platelets: 192 10*3/uL (ref 150–400)
RBC: 3.85 MIL/uL — ABNORMAL LOW (ref 3.87–5.11)
RDW: 13 % (ref 11.5–15.5)
WBC: 6.5 10*3/uL (ref 4.0–10.5)
nRBC: 0 % (ref 0.0–0.2)

## 2020-09-24 NOTE — ED Notes (Signed)
Discharge paperwork reviewed with son, who will be transporting pt back to [facility]  DNR sent with discharge packet handed off to son

## 2020-09-24 NOTE — ED Triage Notes (Signed)
Pt in via EMS from [facility] with c/o weakness. Pt also with dark urine with an odor. Pt has demential. 110/60, HR 62, 95% RA

## 2020-09-24 NOTE — ED Notes (Signed)
Pt to xray

## 2020-09-24 NOTE — ED Provider Notes (Signed)
[facility] Emergency Department Provider Note  ____________________________________________  Time seen: Approximately 2:13 PM  I have reviewed the triage vital signs and the nursing notes.   HISTORY  Chief Complaint Weakness    Level 5 Caveat: Portions of the History and Physical including HPI and review of systems are unable to be completely obtained due to patient being a poor historian   HPI [name] is a 79 y.o. female with a history of bipolar disorder, dementia, thyroid disease who was sent to the ED from [place] due to generalized weakness.  Also reportedly had an episode of syncope there, but no reported falls or other acute symptoms.  There has been note of dark urine lately, concern for UTI.   Additional history obtained from son at bedside who feels that she is eating pretty normally but not taking good fluids.      Past Medical History:  Diagnosis Date  . Bipolar 1 disorder ([place])   . Thyroid disease      There are no problems to display for this patient.    History reviewed. No pertinent surgical history.   Prior to Admission medications   Medication Sig Start Date End Date Taking? Authorizing Provider  acetaminophen (TYLENOL) 325 MG tablet Take 650 mg by mouth every 4 (four) hours as needed for mild pain, fever or headache.    [provider]  ARIPiprazole (ABILIFY) 2 MG tablet Take 2 mg by mouth daily.    [provider]  aspirin 81 MG chewable tablet Chew 81 mg by mouth daily.    [provider]  Biotin 1000 MCG tablet Take 1,000 mcg by mouth 3 (three) times daily.    [provider]  busPIRone (BUSPAR) 15 MG tablet Take 15 mg by mouth 3 (three) times daily.     [provider]  cephALEXin (KEFLEX) 250 MG capsule Take 1 capsule (250 mg total) by mouth 3 (three) times daily. 07/18/19   Paulette Blanch, MD  cholecalciferol (VITAMIN D) 1000 units tablet Take 2,000 Units by mouth  daily.    [provider]  docusate sodium (COLACE) 100 MG capsule Take 100 mg by mouth 2 (two) times daily.     [provider]  levothyroxine (SYNTHROID, LEVOTHROID) 88 MCG tablet Take 88 mcg by mouth daily before breakfast.    [provider]  lisinopril (PRINIVIL,ZESTRIL) 5 MG tablet Take 5 mg by mouth daily.    [provider]  losartan (COZAAR) 100 MG tablet Take 100 mg by mouth daily.    [provider]  megestrol (MEGACE) 40 MG/ML suspension Take 400 mg by mouth daily.     [provider]  Multiple Vitamins-Minerals (MULTIVITAMIN WITH MINERALS) tablet Take 1 tablet by mouth daily.    [provider]  oxyCODONE (OXY IR/ROXICODONE) 5 MG immediate release tablet Take 5 mg by mouth every 4 (four) hours as needed for severe pain.    [provider]  polyethylene glycol (MIRALAX) packet Take 17 g by mouth daily. 04/12/17   Nance Pear, MD  ranitidine (ZANTAC) 150 MG tablet Take 150 mg by mouth 2 (two) times daily.    [provider]  venlafaxine XR (EFFEXOR-XR) 150 MG 24 hr capsule Take 150 mg by mouth daily.    [provider]     Allergies Patient has no known allergies.   No family history on file.  Social History Social History   Tobacco Use  . Smoking status:  Never Smoker  . Smokeless tobacco: Never Used  Substance Use Topics  . Alcohol use: No  . Drug use: No    Review of Systems Level 5 Caveat: Portions of the History and Physical including HPI and review of systems are unable to be completely obtained due to patient being a poor historian   Constitutional:   No known fever.  ENT:   No rhinorrhea. Cardiovascular:   No chest pain, possible syncope. Respiratory:   No dyspnea or cough. Gastrointestinal:   Negative for abdominal pain, vomiting and diarrhea.  Musculoskeletal:   Negative for focal pain or swelling ____________________________________________   PHYSICAL  EXAM:  VITAL SIGNS: ED Triage Vitals  Enc Vitals Group     BP 09/24/20 1308 (!) 101/59     Pulse Rate 09/24/20 1308 (!) 56     Resp 09/24/20 1308 18     Temp 09/24/20 1308 97.7 F (36.5 C)     Temp Source 09/24/20 1308 Oral     SpO2 09/24/20 1308 96 %     Weight 09/24/20 1111 152 lb 1.9 oz (69 kg)     Height 09/24/20 1111 5\' 2"  (1.575 m)     Head Circumference --      Peak Flow --      Pain Score 09/24/20 1111 0     Pain Loc --      Pain Edu? --      Excl. in [place]? --     Vital signs reviewed, nursing assessments reviewed.   Constitutional:   Alert and oriented to self. Non-toxic appearance. Eyes:   Conjunctivae are normal. EOMI. PERRL. ENT      Head:   Normocephalic and atraumatic.      Nose:   No congestion/rhinnorhea.       Mouth/Throat:   Dry mucous membranes, no pharyngeal erythema. No peritonsillar mass.       Neck:   No meningismus. Full ROM. Hematological/Lymphatic/Immunilogical:   No cervical lymphadenopathy. Cardiovascular:   RRR. Symmetric bilateral radial and DP pulses.  No murmurs. Cap refill less than 2 seconds. Respiratory:   Normal respiratory effort without tachypnea/retractions. Breath sounds are clear and equal bilaterally. No wheezes/rales/rhonchi. Gastrointestinal:   Soft and nontender. Non distended. There is no CVA tenderness.  No rebound, rigidity, or guarding.  Musculoskeletal:   Normal range of motion in all extremities. No joint effusions.  Bilateral hip tenderness, right greater than left, no limb shortening. Neurologic:   Normal speech and language.  Motor grossly intact. No acute focal neurologic deficits are appreciated.  Skin:    Skin is warm, dry and intact. No rash noted.  No petechiae, purpura, or bullae.  ____________________________________________    LABS (pertinent positives/negatives) (all labs ordered are listed, but only abnormal results are displayed) Labs Reviewed  BASIC METABOLIC PANEL - Abnormal; Notable for the following  components:      Result Value   Glucose, Bld 122 (*)    All other components within normal limits  CBC - Abnormal; Notable for the following components:   RBC 3.85 (*)    Hemoglobin 11.9 (*)    All other components within normal limits  URINALYSIS, COMPLETE (UACMP) WITH MICROSCOPIC   ____________________________________________   EKG  Interpreted by me Sinus rhythm rate of 64.  Normal axis.  First-degree AV block.  Right bundle branch block.  Normal ST segments and T waves, no acute ischemic changes.  ____________________________________________    RADIOLOGY  No results found.  ____________________________________________   PROCEDURES  Procedures  ____________________________________________  DIFFERENTIAL DIAGNOSIS   UTI, dehydration, electrolyte abnormality, progressive dementia, hip fracture  CLINICAL IMPRESSION / ASSESSMENT AND PLAN / ED COURSE  Medications ordered in the ED: Medications - No data to display  Pertinent labs & imaging results that were available during my care of the patient were reviewed by me and considered in my medical decision making (see chart for details).   [name] was evaluated in Emergency Department on 09/24/2020 for the symptoms described in the history of present illness. She was evaluated in the context of the global COVID-19 pandemic, which necessitated consideration that the patient might be at risk for infection with the SARS-CoV-2 virus that causes COVID-19. Institutional protocols and algorithms that pertain to the evaluation of patients at risk for COVID-19 are in a state of rapid change based on information released by regulatory bodies including the CDC and federal and state organizations. These policies and algorithms were followed during the patient's care in the ED.   Patient presents with generalized weakness, bilateral hip pain without any trauma history.  Will check labs, urinalysis, hip/pelvis x-ray.  If work-up  does not reveal any severe findings, patient can be discharged back to her skilled nursing facility for further care.      ____________________________________________   FINAL CLINICAL IMPRESSION(S) / ED DIAGNOSES    Final diagnoses:  Generalized weakness  Dementia without behavioral disturbance, unspecified dementia type [facility])     ED Discharge Orders    None      Portions of this note were generated with dragon dictation software. Dictation errors may occur despite best attempts at proofreading.   Carrie Mew, MD 09/24/20 1416

## 2020-09-24 NOTE — ED Triage Notes (Signed)
Pt son present with pt. Pt in from facility, concern for UTI. Pt with dark urine and odor

## 2020-09-24 NOTE — ED Notes (Signed)
Pt history difficult to obtain due to patient being a poor historian. Pt son at bedside, used for assisted assessment.   Per son, pt had syncopal episode and was found on the floor. Pt does not remember LOC or fall. Currently denying pain to head, neck, chest. Denies n/v/d, abd pain, fevers, or other symptoms.   EMS reported dark foul urine, pt unable to confirm. Son unsure. Pt oriented to self, appears disoriented to situation, definitely disoriented to place and time.   Pt assisted to wheelchair and back. Well tolerated. Somewhat steady on feet.

## 2020-09-24 NOTE — Discharge Instructions (Addendum)
Her urine was a little dark and appeared more dehydrated than infected.  We will send the urine for a culture, but she does not need antibiotics at this time.  Please push fluids for her at home and return to the ED with any worsening symptoms.

## 2020-09-26 LAB — URINE CULTURE

## 2021-06-09 ENCOUNTER — Ambulatory Visit: Payer: Medicare HMO | Admitting: Podiatry

## 2021-06-09 ENCOUNTER — Other Ambulatory Visit: Payer: Self-pay

## 2021-06-09 ENCOUNTER — Ambulatory Visit (INDEPENDENT_AMBULATORY_CARE_PROVIDER_SITE_OTHER): Payer: Medicare PPO | Admitting: Podiatry

## 2021-06-09 DIAGNOSIS — B351 Tinea unguium: Secondary | ICD-10-CM

## 2021-06-09 DIAGNOSIS — M79675 Pain in left toe(s): Secondary | ICD-10-CM | POA: Diagnosis not present

## 2021-06-09 DIAGNOSIS — M79674 Pain in right toe(s): Secondary | ICD-10-CM | POA: Diagnosis not present

## 2021-06-09 NOTE — Progress Notes (Signed)
   SUBJECTIVE Patient presents to office today complaining of elongated, thickened nails that cause pain while ambulating in shoes.  Patient is unable to trim their own nails. Patient is here for further evaluation and treatment.  Past Medical History:  Diagnosis Date   Bipolar 1 disorder ([place])    Thyroid disease     OBJECTIVE General Patient is awake, alert, and oriented x 3 and in no acute distress. Derm Skin is dry and supple bilateral. Negative open lesions or macerations. Remaining integument unremarkable. Nails are tender, long, thickened and dystrophic with subungual debris, consistent with onychomycosis, 1-5 bilateral. No signs of infection noted. Vasc  DP and PT pedal pulses palpable bilaterally. Temperature gradient within normal limits.  Neuro Epicritic and protective threshold sensation grossly intact bilaterally.  Musculoskeletal Exam No symptomatic pedal deformities noted bilateral. Muscular strength within normal limits.  ASSESSMENT 1.  Pain due to onychomycosis of toenails both  PLAN OF CARE 1. Patient evaluated today.  2. Instructed to maintain good pedal hygiene and foot care.  3. Mechanical debridement of nails 1-5 bilaterally performed using a nail nipper. Filed with dremel without incident.  4. Return to clinic in 3 mos.    Edrick Kins, DPM [facility]  Dr. Edrick Kins, DPM    [address], [place] [address]                Office [phone]  Fax [phone]

## 2021-09-18 ENCOUNTER — Encounter: Payer: Self-pay | Admitting: Podiatry

## 2021-09-18 ENCOUNTER — Ambulatory Visit (INDEPENDENT_AMBULATORY_CARE_PROVIDER_SITE_OTHER): Payer: Medicare PPO | Admitting: Podiatry

## 2021-09-18 DIAGNOSIS — M79674 Pain in right toe(s): Secondary | ICD-10-CM

## 2021-09-18 DIAGNOSIS — B351 Tinea unguium: Secondary | ICD-10-CM

## 2021-09-18 DIAGNOSIS — M79675 Pain in left toe(s): Secondary | ICD-10-CM

## 2021-09-18 NOTE — Progress Notes (Signed)
This patient returns to the office for evaluation and treatment of long thick painful nails .  This patient is unable to trim his own nails since the patient cannot reach his feet.  Patient says the nails are painful walking and wearing his shoes.  He returns for preventive foot care services.  General Appearance  Alert, conversant and in no acute stress.  Vascular  Dorsalis pedis and posterior tibial  pulses are palpable  bilaterally.  Capillary return is within normal limits  bilaterally. Temperature is within normal limits  bilaterally.  Neurologic  Senn-Weinstein monofilament wire test within normal limits  bilaterally. Muscle power within normal limits bilaterally.  Nails Thick disfigured discolored nails with subungual debris  from hallux to fifth toes bilaterally. No evidence of bacterial infection or drainage bilaterally.  Orthopedic  No limitations of motion  feet .  No crepitus or effusions noted.  No bony pathology or digital deformities noted.  Skin  normotropic skin with no porokeratosis noted bilaterally.  No signs of infections or ulcers noted.     Onychomycosis  Pain in toes right foot  Pain in toes left foot  Debridement  of nails  1-5  B/L with a nail nipper.  Nails were then filed using a dremel tool with no incidents.    RTC 6 months   Gardiner Barefoot DPM

## 2022-03-19 ENCOUNTER — Ambulatory Visit: Payer: Medicare Other | Admitting: Podiatry

## 2022-03-19 ENCOUNTER — Encounter: Payer: Self-pay | Admitting: Podiatry

## 2022-03-19 ENCOUNTER — Ambulatory Visit (INDEPENDENT_AMBULATORY_CARE_PROVIDER_SITE_OTHER): Payer: Medicare Other | Admitting: Podiatry

## 2022-03-19 DIAGNOSIS — B351 Tinea unguium: Secondary | ICD-10-CM | POA: Diagnosis not present

## 2022-03-19 DIAGNOSIS — M79675 Pain in left toe(s): Secondary | ICD-10-CM | POA: Diagnosis not present

## 2022-03-19 DIAGNOSIS — M79674 Pain in right toe(s): Secondary | ICD-10-CM | POA: Diagnosis not present

## 2022-03-19 NOTE — Progress Notes (Signed)
This patient returns to the office for evaluation and treatment of long thick painful nails .  This patient is unable to trim his own nails since the patient cannot reach his feet.  Patient says the nails are painful walking and wearing his shoes. She returns for preventive foot care services.  General Appearance  Alert, conversant and in no acute stress.  Vascular  Dorsalis pedis and posterior tibial  pulses are palpable  bilaterally.  Capillary return is within normal limits  bilaterally. Temperature is within normal limits  bilaterally.  Neurologic  Senn-Weinstein monofilament wire test within normal limits  bilaterally. Muscle power within normal limits bilaterally.  Nails Thick disfigured discolored nails with subungual debris  from hallux to fifth toes bilaterally. No evidence of bacterial infection or drainage bilaterally.  Orthopedic  No limitations of motion  feet .  No crepitus or effusions noted.  No bony pathology or digital deformities noted.  Skin  normotropic skin with no porokeratosis noted bilaterally.  No signs of infections or ulcers noted.     Onychomycosis  Pain in toes right foot  Pain in toes left foot  Debridement  of nails  1-5  B/L with a nail nipper.  Nails were then filed using a dremel tool with no incidents.    RTC 6 months   Gardiner Barefoot DPM

## 2022-03-22 ENCOUNTER — Ambulatory Visit: Payer: Medicare Other | Admitting: Podiatry

## 2022-03-31 ENCOUNTER — Emergency Department: Payer: Medicare Other

## 2022-03-31 ENCOUNTER — Inpatient Hospital Stay
Admission: EM | Admit: 2022-03-31 | Discharge: 2022-04-05 | DRG: 061 | Disposition: A | Discharge status: Skilled Nursing Facility | Payer: Medicare Other | Attending: Internal Medicine | Admitting: Internal Medicine

## 2022-03-31 DIAGNOSIS — Z79890 Hormone replacement therapy (postmenopausal): Secondary | ICD-10-CM | POA: Diagnosis not present

## 2022-03-31 DIAGNOSIS — F02C3 Dementia in other diseases classified elsewhere, severe, with mood disturbance: Secondary | ICD-10-CM | POA: Diagnosis present

## 2022-03-31 DIAGNOSIS — I440 Atrioventricular block, first degree: Secondary | ICD-10-CM | POA: Diagnosis present

## 2022-03-31 DIAGNOSIS — R471 Dysarthria and anarthria: Secondary | ICD-10-CM | POA: Diagnosis present

## 2022-03-31 DIAGNOSIS — I480 Paroxysmal atrial fibrillation: Secondary | ICD-10-CM | POA: Diagnosis present

## 2022-03-31 DIAGNOSIS — R29716 NIHSS score 16: Secondary | ICD-10-CM | POA: Diagnosis present

## 2022-03-31 DIAGNOSIS — Z885 Allergy status to narcotic agent status: Secondary | ICD-10-CM | POA: Diagnosis not present

## 2022-03-31 DIAGNOSIS — G8194 Hemiplegia, unspecified affecting left nondominant side: Secondary | ICD-10-CM | POA: Diagnosis present

## 2022-03-31 DIAGNOSIS — K5909 Other constipation: Secondary | ICD-10-CM | POA: Diagnosis present

## 2022-03-31 DIAGNOSIS — F419 Anxiety disorder, unspecified: Secondary | ICD-10-CM | POA: Diagnosis present

## 2022-03-31 DIAGNOSIS — G9389 Other specified disorders of brain: Secondary | ICD-10-CM | POA: Diagnosis present

## 2022-03-31 DIAGNOSIS — I4510 Unspecified right bundle-branch block: Secondary | ICD-10-CM | POA: Diagnosis present

## 2022-03-31 DIAGNOSIS — E039 Hypothyroidism, unspecified: Secondary | ICD-10-CM | POA: Diagnosis present

## 2022-03-31 DIAGNOSIS — F05 Delirium due to known physiological condition: Secondary | ICD-10-CM | POA: Diagnosis present

## 2022-03-31 DIAGNOSIS — G936 Cerebral edema: Secondary | ICD-10-CM | POA: Diagnosis present

## 2022-03-31 DIAGNOSIS — F319 Bipolar disorder, unspecified: Secondary | ICD-10-CM | POA: Diagnosis present

## 2022-03-31 DIAGNOSIS — F0392 Unspecified dementia, unspecified severity, with psychotic disturbance: Secondary | ICD-10-CM | POA: Diagnosis not present

## 2022-03-31 DIAGNOSIS — I639 Cerebral infarction, unspecified: Secondary | ICD-10-CM | POA: Diagnosis present

## 2022-03-31 DIAGNOSIS — R1310 Dysphagia, unspecified: Secondary | ICD-10-CM | POA: Diagnosis present

## 2022-03-31 DIAGNOSIS — Q046 Congenital cerebral cysts: Secondary | ICD-10-CM

## 2022-03-31 DIAGNOSIS — T82898A Other specified complication of vascular prosthetic devices, implants and grafts, initial encounter: Secondary | ICD-10-CM | POA: Diagnosis not present

## 2022-03-31 DIAGNOSIS — I63511 Cerebral infarction due to unspecified occlusion or stenosis of right middle cerebral artery: Secondary | ICD-10-CM | POA: Diagnosis present

## 2022-03-31 DIAGNOSIS — R4701 Aphasia: Secondary | ICD-10-CM | POA: Diagnosis present

## 2022-03-31 DIAGNOSIS — E663 Overweight: Secondary | ICD-10-CM | POA: Diagnosis present

## 2022-03-31 DIAGNOSIS — Z66 Do not resuscitate: Secondary | ICD-10-CM | POA: Diagnosis present

## 2022-03-31 DIAGNOSIS — Z91013 Allergy to seafood: Secondary | ICD-10-CM

## 2022-03-31 DIAGNOSIS — Z79899 Other long term (current) drug therapy: Secondary | ICD-10-CM | POA: Diagnosis not present

## 2022-03-31 LAB — CBC WITH DIFFERENTIAL/PLATELET
Abs Immature Granulocytes: 0.03 10*3/uL (ref 0.00–0.07)
Basophils Absolute: 0 10*3/uL (ref 0.0–0.1)
Basophils Relative: 0 %
Eosinophils Absolute: 0 10*3/uL (ref 0.0–0.5)
Eosinophils Relative: 0 %
HCT: 37.7 % (ref 36.0–46.0)
Hemoglobin: 11.9 g/dL — ABNORMAL LOW (ref 12.0–15.0)
Immature Granulocytes: 0 %
Lymphocytes Relative: 20 %
Lymphs Abs: 1.5 10*3/uL (ref 0.7–4.0)
MCH: 29.2 pg (ref 26.0–34.0)
MCHC: 31.6 g/dL (ref 30.0–36.0)
MCV: 92.6 fL (ref 80.0–100.0)
Monocytes Absolute: 0.5 10*3/uL (ref 0.1–1.0)
Monocytes Relative: 7 %
Neutro Abs: 5.5 10*3/uL (ref 1.7–7.7)
Neutrophils Relative %: 73 %
Platelets: 169 10*3/uL (ref 150–400)
RBC: 4.07 MIL/uL (ref 3.87–5.11)
RDW: 12.8 % (ref 11.5–15.5)
WBC: 7.6 10*3/uL (ref 4.0–10.5)
nRBC: 0 % (ref 0.0–0.2)

## 2022-03-31 LAB — APTT: aPTT: 24 seconds (ref 24–36)

## 2022-03-31 LAB — COMPREHENSIVE METABOLIC PANEL
ALT: 12 U/L (ref 0–44)
AST: 26 U/L (ref 15–41)
Albumin: 3.8 g/dL (ref 3.5–5.0)
Alkaline Phosphatase: 104 U/L (ref 38–126)
Anion gap: 9 (ref 5–15)
BUN: 13 mg/dL (ref 8–23)
CO2: 26 mmol/L (ref 22–32)
Calcium: 8.8 mg/dL — ABNORMAL LOW (ref 8.9–10.3)
Chloride: 105 mmol/L (ref 98–111)
Creatinine, Ser: 0.81 mg/dL (ref 0.44–1.00)
GFR, Estimated: >60 mL/min (ref >60)
Glucose, Bld: 107 mg/dL — ABNORMAL HIGH (ref 70–99)
Potassium: 4.3 mmol/L (ref 3.5–5.1)
Sodium: 140 mmol/L (ref 135–145)
Total Bilirubin: 0.6 mg/dL (ref 0.3–1.2)
Total Protein: 7.5 g/dL (ref 6.5–8.1)

## 2022-03-31 LAB — GLUCOSE, CAPILLARY: Glucose-Capillary: 108 mg/dL — ABNORMAL HIGH (ref 70–99)

## 2022-03-31 LAB — PROTIME-INR
INR: 1 (ref 0.8–1.2)
Prothrombin Time: 13 seconds (ref 11.4–15.2)

## 2022-03-31 MED ORDER — NICARDIPINE HCL IN NACL 20-0.86 MG/200ML-% IV SOLN: 3.0000 mg/h | INTRAVENOUS | Status: DC

## 2022-03-31 MED ORDER — TENECTEPLASE FOR STROKE: 0.2500 mg/kg | PACK | Freq: Once | INTRAVENOUS | Status: AC

## 2022-03-31 MED ORDER — DOCUSATE SODIUM 100 MG PO CAPS: 100.0000 mg | ORAL_CAPSULE | Freq: Two times a day (BID) | ORAL | Status: DC | PRN

## 2022-03-31 MED ORDER — CLEVIDIPINE BUTYRATE 0.5 MG/ML IV EMUL
0.0000 mg/h | INTRAVENOUS | Status: DC
  Filled 2022-03-31: qty 50

## 2022-03-31 MED ORDER — LABETALOL HCL 5 MG/ML IV SOLN: 10.0000 mg | Freq: Once | INTRAVENOUS | Status: AC

## 2022-03-31 MED ORDER — LABETALOL HCL 5 MG/ML IV SOLN
INTRAVENOUS | Status: AC
  Administered 2022-03-31: 10 mg via INTRAVENOUS
  Filled 2022-03-31: qty 4

## 2022-03-31 MED ORDER — POLYETHYLENE GLYCOL 3350 17 G PO PACK: 17.0000 g | PACK | Freq: Every day | ORAL | Status: DC | PRN

## 2022-03-31 MED ORDER — LABETALOL HCL 5 MG/ML IV SOLN: 10.0000 mg | INTRAVENOUS | Status: DC | PRN

## 2022-03-31 MED ORDER — STROKE: EARLY STAGES OF RECOVERY BOOK: Freq: Once | Status: DC

## 2022-03-31 MED ORDER — TENECTEPLASE FOR STROKE
PACK | INTRAVENOUS | Status: AC
  Administered 2022-03-31: 18 mg via INTRAVENOUS
  Filled 2022-03-31: qty 10

## 2022-03-31 NOTE — ED Notes (Signed)
PT L arm IV site is bleeding with large hematoma. IV removed.

## 2022-03-31 NOTE — ED Triage Notes (Signed)
Pt arrived from [facility] memory care with AMS, weakness on L side. LKW was 1700 and pt suddenly became aphasic and unable to move L side. PMH dementia.

## 2022-03-31 NOTE — Consult Note (Signed)
NEURO HOSPITALIST CONSULT NOTE   Requestig physician: Dr. Kerman Passey  Reason for Consult: Acute onset of aphasia and left hemiparesis  History obtained from:  EMS, Son and Chart     HPI:                                                                                                                                          [name] is an 81 y.o. left handed female with a history of dementia, anxiety, bipolar disorder and thyroid disease, resident of [place], presenting with acute onset of aphasia and left sided weakness. LKN  was 1700, at which staff noted her to be walking and talking consistent with her baseline. While at dinner, she suddenly became unable to speak and left sided weakness was also noted at that time. EMS was called and on arrival they noted same. Code Stroke was called in the field. CBG was 92.    Past Medical History:  Diagnosis Date   Anxiety    Bipolar 1 disorder ([place])    Thyroid disease     No past surgical history on file.  No family history on file.          Social History:  reports that she has never smoked. She has never used smokeless tobacco. She reports that she does not drink alcohol and does not use drugs.  Allergies  Allergen Reactions   Codeine Phosphate [Codeine]     bulk   Shellfish Allergy     HOME MEDICATIONS:                                                                                                                      No current facility-administered medications on file prior to encounter.   Current Outpatient Medications on File Prior to Encounter  Medication Sig Dispense Refill   acetaminophen (TYLENOL) 325 MG tablet Take 650 mg by mouth every 4 (four) hours as needed for mild pain, fever or headache. (Patient not taking: Reported on 09/18/2021)     ARIPiprazole (ABILIFY) 2 MG tablet Take 2 mg by mouth daily.     aspirin 81 MG chewable tablet Chew 81 mg by mouth daily. (Patient not taking:  Reported on 09/18/2021)     Biotin 1000 MCG  tablet Take 1,000 mcg by mouth 3 (three) times daily.     busPIRone (BUSPAR) 15 MG tablet Take 15 mg by mouth 3 (three) times daily.      cephALEXin (KEFLEX) 250 MG capsule Take 1 capsule (250 mg total) by mouth 3 (three) times daily. (Patient not taking: Reported on 09/18/2021) 21 capsule 0   cholecalciferol (VITAMIN D) 1000 units tablet Take 2,000 Units by mouth daily. (Patient not taking: Reported on 09/18/2021)     docusate sodium (COLACE) 100 MG capsule Take 100 mg by mouth 2 (two) times daily.      Ensure Plus (ENSURE PLUS) LIQD Take 237 mLs by mouth.     Influenza Vac High-Dose Quad (FLUZONE HIGH-DOSE QUADRIVALENT IM) Inject 240 mcg into the muscle.     levothyroxine (SYNTHROID, LEVOTHROID) 88 MCG tablet Take 88 mcg by mouth daily before breakfast.     lisinopril (PRINIVIL,ZESTRIL) 5 MG tablet Take 5 mg by mouth daily. (Patient not taking: Reported on 09/18/2021)     losartan (COZAAR) 100 MG tablet Take 100 mg by mouth daily. (Patient not taking: Reported on 09/18/2021)     megestrol (MEGACE) 40 MG/ML suspension Take 400 mg by mouth daily.  (Patient not taking: Reported on 09/18/2021)     Multiple Vitamins-Minerals (MULTIVITAMIN WITH MINERALS) tablet Take 1 tablet by mouth daily. (Patient not taking: Reported on 09/18/2021)     nystatin [facility]) powder Apply 1 application topically daily.     oxyCODONE (OXY IR/ROXICODONE) 5 MG immediate release tablet Take 5 mg by mouth every 4 (four) hours as needed for severe pain. (Patient not taking: Reported on 09/18/2021)     polyethylene glycol (MIRALAX) packet Take 17 g by mouth daily. (Patient not taking: Reported on 09/18/2021) 14 each 0   PSYLLIUM HUSK PO Take 1 capsule by mouth at bedtime.     ranitidine (ZANTAC) 150 MG tablet Take 150 mg by mouth 2 (two) times daily. (Patient not taking: Reported on 09/18/2021)     venlafaxine XR (EFFEXOR-XR) 150 MG 24 hr capsule Take 150 mg by mouth daily.        ROS:                                                                                                                                       Unable to obtain due to aphasia.    There were no vitals taken for this visit.   General Examination:  Physical Exam  HEENT-  [place]/AT    Lungs- Respirations unlabored Extremities- Warm and well perfused   Neurological Examination Mental Status: Awake and attends to stimuli. Appears alert. Will gaze at and make eye contact with examiner, EMS personnel and nurses. Follows about 30% of simple one step commands, including "stick out your tongue" but does not follow any complex commands and is nonverbal.  Cranial Nerves: II: Blinks to threat in right hemifields but not in left. PERRL.   III,IV, VI: No ptosis. Will track and initiate saccades to the right and left. No nystagmus.  V: Reacts less briskly to right sided stimuli.  VII: Does not grimace to command or to noxious. Lips tend to be in a pursed position. No definite asymmetry in this context.  VIII: Hearing intact to voice IX,X: \Unable to assess XI: Head is midline XII: Midline tongue extension Motor/Sensory: RUE 5/5 with brisk reaction to noxious RLE 5/5 with brisk reaction to noxious LUE with some movement to noxious that is antigravity, but arm will drop to stretcher without resistance to gravity after passive elevation and release by examiner LLE with withdrawal to noxious but slightly less brisk than on the right. Slow drift towards bed after passive elevation Deep Tendon Reflexes: 1-2+ and symmetric throughout Cerebellar: Unable to assess due to patient not following complex commands Gait: Unable to assess  NIHSS: 16   Lab Results: Basic Metabolic Panel: No results for input(s): NA, K, CL, CO2, GLUCOSE, BUN, CREATININE, CALCIUM, MG, PHOS in the last 168 hours.  CBC: No  results for input(s): WBC, NEUTROABS, HGB, HCT, MCV, PLT in the last 168 hours.  Cardiac Enzymes: No results for input(s): CKTOTAL, CKMB, CKMBINDEX, TROPONINI in the last 168 hours.  Lipid Panel: No results for input(s): CHOL, TRIG, HDL, CHOLHDL, VLDL, LDLCALC in the last 168 hours.  Imaging: No results found.   Assessment: 81 year old female with a history of dementia, resident of [place] presenting with acute onset of aphasia and left hemiparesis 1. Exam reveals findings referable to the right MCA territory. She is left handed which may explain her aphasia in the context of right MCA stroke. NIHSS: 16. 2. CT head: Question acute/subacute infarction in the right external capsule region. 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 Extensive chronic small-vessel ischemic changes elsewhere throughout the brain, progressive since 2019. 3. EKG: Sinus rhythm; Prolonged PR interval; Right bundle branch block 4. After comprehensive review of possible contraindications, she has no absolute contraindications to TNK administration. 5. Patient is an IV thrombolysis candidate. Discussed extensively the risks/benefits of IV thrombolysis treatment vs. no treatment with the patient's son, including risks of hemorrhage and death with IV thrombolysis administration versus worse overall outcomes on average in patients within thrombolysis time window who are not administered TNK. The patient's aphasia precludes meaningful medical decision making on her part at this time  Overall benefits of TNK regarding long-term prognosis are felt to outweigh risks. The patient's son expressed understanding and wish to proceed with TNK.  6. She is DNR and son confirms that she would not want to be intubated for a procedure. Not a thrombectomy candidate.  7. The patient's son, Kayann Maj, can be reached at [phone]. 8. Patient's facility medication list,  which was brought in by EMS today, does not list ASA. However, a list of medications in Epic, which is most likely outdated, does include ASA.   Recommendations: 1. Admit to the ICU  following administration of TNK  2. Post-TNK order set to include frequent neuro checks and BP management.  3. No antiplatelet medications or anticoagulants for at least 24 hours following TNK.  4. DVT prophylaxis with SCDs.  5. Will need to be started on a statin.  6. Will need to be restarted on antiplatelet therapy if follow up CT at 24 hours is negative for hemorrhagic conversion. 7. Cardiac telemetry  8. TTE.  9. MRI brain 10. CTA of head and neck  11. NPO until passes swallow evaluation.  12. Fasting lipid panel, HgbA1c 13.  PT/OT/Speech.   Electronically signed: Dr. Kerney Elbe 03/31/2022, 6:31 PM

## 2022-03-31 NOTE — H&P (Signed)
NAME:  [name], MRN:  [id number], DOB:  09/13/1941, LOS: 0 ADMISSION DATE:  03/31/2022, CONSULTATION DATE:  03/31/22 REFERRING MD:  Dr. Kerman Passey, CHIEF COMPLAINT:  CODE STROKE  History of Present Illness:  81 year old female presenting to [facility] ED via EMS from [place] on 03/31/2022 for evaluation of acute onset difficulty speaking and reported left-sided deficits.  Per ED documentation, EMS reported the patient was last seen well at 1700 at her nursing facility by staff.  Shortly after this in the cafeteria she was noted to be slumped over, aphasic and appeared to have left upper extremity weakness.  Per her son bedside, the patient's baseline is talkative, ambulatory, with dementia requiring memory care and assistance with bathing only. ED course: Upon arrival code stroke initiated with neurology consulting bedside.  Patient considered candidate for TNK which was initiated at 1845.  Pre-TNKase NIHSS 16, patient remains aphasic with weakness in her left upper extremity. medications given: Labetalol IV, TNKase Initial Vitals: 98.6, 19, 82, 145/85 and SPO2 99% on room air Significant labs: (Labs/ Imaging personally reviewed) I, Domingo Pulse [place], AGACNP-BC, personally viewed and interpreted this ECG. EKG Interpretation: Date: 03/31/2022, EKG Time: 1849, Rate: 67, Rhythm: NSR, QRS Axis: LAD, Intervals: First-degree heart block, RBBB, borderline QTc prolongation, ST/T Wave abnormalities: Nonspecific T wave abnormalities, Narrative Interpretation: NSR with first-degree heart block and RBBB  Chemistry: Na+: 140, K+: 4.3, BUN/Cr.:  13/0.81, Serum CO2/ AG: 26/9 Hematology: WBC: 7.6, Hgb: 11.9,   CT head wo contrast 03/31/22: Question acute/subacute infarction in the right external capsule region.  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  Extensive chronic small vessel ischemic changes elsewhere throughout the brain  progressive since 2019  PCCM consulted for admission due to [facility] administration in the setting of acute ischemic CVA.  Pertinent  Medical History  Anxiety Bipolar disorder Thyroid disease  Significant Hospital Events: Including procedures, antibiotic start and stop dates in addition to other pertinent events   03/31/2022: Admit to ICU as code stroke status post TNKase administration.  Interim History / Subjective:  Patient alert and responsive but aphasic.  Responds with first name with persistent stimulation and questioning, also able to say son's name.  No other verbal responses noted.  Patient able to follow some commands. Son and son's fianc present bedside, plan of care discussed all questions and concerns answered at this time  Objective   Blood pressure (!) 145/60, pulse 88, temperature 98.6 F (37 C), resp. rate 16, weight 71.4 kg, SpO2 97 %.       No intake or output data in the 24 hours ending 03/31/22 2052 Filed Weights   03/31/22 1833  Weight: 71.4 kg    Examination: General: Adult female, acutely ill, lying in bed, NAD HEENT: MM pink/moist, anicteric, atraumatic, neck supple Neuro: A&O x 1, able to follow simple intermittent commands, PERRL +3, MAE CN: Challenging assessment while patient nonverbal and only following commands intermittently II - UTA III, IV, VI - Extraocular movements intact. V - UTA VII - Facial movement appears intact bilaterally (could not get the patient to smile) VIII - Hearing & vestibular appears intact bilaterally X - Palate elevates symmetrically XI - Chin turning & shoulder shrug appear intact bilaterally. XII - Tongue protrusion intact Motor Strength - strength 4/5 RUE & 2/5 LUE with pronator drift. RLE & LLE 3/5 with possible drift present.  Sensory - UTA Coordination -  UTA, Tremor was absent Gait and  Station - deferred.  NIHSS Level of  Consciousness: 0 = Alert, keenly responsive; 1 = Not alert, but arousable by minor  stimulation to obey, answer, or respond; 2 = Not alert, requires repeated stimulation to attend, or is obtunded and requires strong or painful stimulation to make movements (not stereotyped); 3 = Responds only with reflex motor or autonomic effects or totally unresponsive, flaccid, and areflexic   0  LOC Questions 0 = Answers both questions correctly; 1 = Answers one question correctly; 2 = Answers neither question correctly.  1  LOC Commands 0 = Performs both tasks correctly; 1 = Performs one task correctly; 2 = Performs neither task correctly  2  Best Gaze  0 = Normal; 1 = Partial gaze palsy; gaze is abnormal in one or both eyes, but forced deviation or total gaze paresis is not present; 2 = Forced deviation, or total gaze paresis not overcome by the oculocephalic maneuver   0  Visual 0 = No visual loss; 1 = Partial hemianopia; 2 = Complete hemianopia; 3 = Bilateral hemianopia (blind including cortical blindness).   0  Facial Palsy 0 = Normal symmetrical movements; 1 = Minor paralysis (flattened nasolabial fold, asymmetry on smiling); 2 = Partial paralysis (total or near-total paralysis of lower face); 3 = Complete paralysis of one or both sides (absence of facial movement in the upper and lower face).  0  Motor Arm LEFT 0 = No drift; limb holds 90 (or 45) degrees for full 10 secs; 1 = Drift; limb holds 90 (or 45) degrees, but drifts down before full 10 seconds; does not hit bed or other support; 2 = Some effort against gravity; limb cannot get to or maintain (if cued) 90 (or 45) degrees, drifts down to bed, but has some effort against gravity; 3 = No effort against gravity; limb falls; 4 = No movement; UN = unable to test (Amputation or joint fusion).   2  Motor Arm RIGHT 0 = No drift; limb holds 90 (or 45) degrees for full 10 secs; 1 = Drift; limb holds 90 (or 45) degrees, but drifts down before full 10 seconds; does not hit bed or other support; 2 = Some effort against gravity; limb cannot get to or  maintain (if cued) 90 (or 45) degrees, drifts down to bed, but has some effort against gravity; 3 = No effort against gravity; limb falls; 4 = No movement; UN = unable to test (Amputation or joint fusion).   0  Motor Leg LEFT 0 = No drift; leg holds 30 degree position for full 5 secs; 1 = Drift; leg falls by the end of the 5-sec period but does not hit bed; 2 = Some effort against gravity; leg falls to bed by 5 secs, but has some effort against gravity; 3 = No effort against gravity; leg falls to bed immediately; 4 = No movement; UN = unable to test (Amputation or joint fusion).   1  Motor Leg RIGHT 0 = No drift; leg holds 30 degree position for full 5 secs; 1 = Drift; leg falls by the end of the 5-sec period but does not hit bed; 2 = Some effort against gravity; leg falls to bed by 5 secs, but has some effort against gravity; 3 = No effort against gravity; leg falls to bed immediately; 4 = No movement; UN = unable to test (Amputation or joint fusion).   1  Limb Ataxia 0 = Absent; 1 = Present in one limb; 2 =  Present in two limbs; UN = Amputation or joint fusion   0  Sensory  0 = Normal, no sensory loss; 1 = Mild-to-moderate sensory loss, patient feels pinprick is less sharp or is dull on the affected side, or there is a loss of superficial pain with pinprick, but patient is aware of being touched; 2 = Severe to total sensory loss, patient is not aware of being touched in the face, arm, and leg.   0  Best Language 0 = No aphasia; normal; 1 = Mild-to-moderate aphasia; some obvious loss of fluency or facility of comprehension, w/o significant limitation on ideas expressed or form of expression. Reduction of speech and/or comprehension, however, makes conversation about provided materials difficult or impossible. For example, in conversation about provided materials, examiner can identify picture or naming card content from patient's response; 2 = Severe aphasia; all communication is through fragmentary  expression; great need for inference, questioning, and guessing by the listener. Range of information that can be exchanged is limited; listener carries burden of communication. Examiner cannot identify materials provided from patient response; 3 = Mute, global aphasia; no usable speech or auditory comprehension  2  Dysarthria 0 = Normal; 1 = Mild-to-moderate dysarthria, patient slurs at least some words and, at worst, can be understood with some difficulty; 2 = Severe dysarthria, patient's speech is so slurred as to be unintelligible in the absence of or out of proportion to any dysphasia, or is mute/anarthric; UN = Intubated or other physical barrier  2  Extinction/Inattention 0 = No abnormality 1 = Visual, tactile, auditory, spatial, or personal extinction/inattention to bilateral simultaneous stimulation in one of the sensory modalities. 2 = Profound hemi-inattention or extinction to more than one modality; does not recognize own hand or orients to only one side of space.  0  Total   11    CV: s1s2 RRR, NSR with 1st degree HB & RBBB on monitor, no r/m/g Pulm: Regular, non labored on RA , breath sounds clear-BUL & diminished-BLL GI: soft, rounded, non tender, bs x 4 Skin: no rashes/lesions noted Extremities: warm/dry, pulses + 2 R/P, no edema noted   Resolved Hospital Problem list     Assessment & Plan:  Acute Ischemic Stroke  suspect secondary to small vessel disease source TNKase given 18:45 03/31/22, LKW 17:00  - Modified NIHSS & vitals q 15 m x 2 h, q 30 m x 6 h, q 1 h x 16 h - NIHSS Q shift - perform bedside swallow > SLP eval if needed - PT/OT consult - Neurology consulted, appreciate input - f/u MRI brain, carotid US ordered - Echocardiogram ordered - f/u Lipid panel & Hgb A1c  - initiate statin once able to take PO medication -SCD's for VTE prophylaxis - follow ICU hypo/hyper-glycemia protocol - labetalol IV PRN to maintain BP < 180/105, utilize nicardipine gtt  PRN  Hypothyroidism - hold PO medications currently, consider adjusting to IV synthroid if unable to take PO medications safely in next 3 days  Best Practice (right click and "Reselect all SmartList Selections" daily)  Diet/type: NPO DVT prophylaxis: SCD GI prophylaxis: PPI Lines: N/A Foley:  N/A Code Status:  DNR Last date of multidisciplinary goals of care discussion [03/31/22]  Labs   CBC: Recent Labs  Lab 03/31/22 1852  WBC 7.6  NEUTROABS 5.5  HGB 11.9*  HCT 37.7  MCV 92.6  PLT 709    Basic Metabolic Panel: Recent Labs  Lab 03/31/22 1852  NA 140  K 4.3  CL 105  CO2 26  GLUCOSE 107*  BUN 13  CREATININE 0.81  CALCIUM 8.8*   GFR: CrCl cannot be calculated (Unknown ideal weight.). Recent Labs  Lab 03/31/22 1852  WBC 7.6    Liver Function Tests: Recent Labs  Lab 03/31/22 1852  AST 26  ALT 12  ALKPHOS 104  BILITOT 0.6  PROT 7.5  ALBUMIN 3.8   No results for input(s): LIPASE, AMYLASE in the last 168 hours. No results for input(s): AMMONIA in the last 168 hours.  ABG No results found for: PHART, PCO2ART, PO2ART, HCO3, TCO2, ACIDBASEDEF, O2SAT   Coagulation Profile: Recent Labs  Lab 03/31/22 1852  INR 1.0    Cardiac Enzymes: No results for input(s): CKTOTAL, CKMB, CKMBINDEX, TROPONINI in the last 168 hours.  HbA1C: No results found for: HGBA1C  CBG: No results for input(s): GLUCAP in the last 168 hours.  Review of Systems:   UTA- patient unable to participate in interview  Past Medical History:  She,  has a past medical history of Anxiety, Bipolar 1 disorder ([place]), and Thyroid disease.   Surgical History:  No past surgical history on file.   Social History:   reports that she has never smoked. She has never used smokeless tobacco. She reports that she does not drink alcohol and does not use drugs.   Family History:  Her family history is not on file.   Allergies Allergies  Allergen Reactions   Codeine Phosphate [Codeine]      bulk   Shellfish Allergy      Home Medications  Prior to Admission medications   Medication Sig Start Date End Date Taking? Authorizing Provider  acetaminophen (TYLENOL) 325 MG tablet Take 650 mg by mouth every 4 (four) hours as needed for mild pain, fever or headache. Patient not taking: Reported on 09/18/2021    [provider]  ARIPiprazole (ABILIFY) 2 MG tablet Take 2 mg by mouth daily.    [provider]  aspirin 81 MG chewable tablet Chew 81 mg by mouth daily. Patient not taking: Reported on 09/18/2021    [provider]  Biotin 1000 MCG tablet Take 1,000 mcg by mouth 3 (three) times daily.    [provider]  busPIRone (BUSPAR) 15 MG tablet Take 15 mg by mouth 3 (three) times daily.     [provider]  cephALEXin (KEFLEX) 250 MG capsule Take 1 capsule (250 mg total) by mouth 3 (three) times daily. Patient not taking: Reported on 09/18/2021 07/18/19   Paulette Blanch, MD  cholecalciferol (VITAMIN D) 1000 units tablet Take 2,000 Units by mouth daily. Patient not taking: Reported on 09/18/2021    [provider]  docusate sodium (COLACE) 100 MG capsule Take 100 mg by mouth 2 (two) times daily.     [provider]  Ensure Plus (ENSURE PLUS) LIQD Take 237 mLs by mouth.    [provider]  Influenza Vac High-Dose Quad (FLUZONE HIGH-DOSE QUADRIVALENT IM) Inject 240 mcg into the muscle.    [provider]  levothyroxine (SYNTHROID, LEVOTHROID) 88 MCG tablet Take 88 mcg by mouth daily before breakfast.    [provider]  lisinopril (PRINIVIL,ZESTRIL) 5 MG tablet Take 5 mg by mouth daily. Patient not taking: Reported on 09/18/2021    [provider]  losartan (COZAAR) 100 MG tablet Take 100 mg by mouth daily. Patient not taking: Reported on 09/18/2021    [provider]  megestrol (MEGACE) 40 MG/ML suspension Take 400 mg by  mouth daily.  Patient not taking: Reported on 09/18/2021     [provider]  Multiple Vitamins-Minerals (MULTIVITAMIN WITH MINERALS) tablet Take 1 tablet by mouth daily. Patient not taking: Reported on 09/18/2021    [provider]  nystatin [facility]) powder Apply 1 application topically daily.    [provider]  oxyCODONE (OXY IR/ROXICODONE) 5 MG immediate release tablet Take 5 mg by mouth every 4 (four) hours as needed for severe pain. Patient not taking: Reported on 09/18/2021    [provider]  polyethylene glycol (MIRALAX) packet Take 17 g by mouth daily. Patient not taking: Reported on 09/18/2021 04/12/17   Nance Pear, MD  PSYLLIUM HUSK PO Take 1 capsule by mouth at bedtime.    [provider]  ranitidine (ZANTAC) 150 MG tablet Take 150 mg by mouth 2 (two) times daily. Patient not taking: Reported on 09/18/2021    [provider]  venlafaxine XR (EFFEXOR-XR) 150 MG 24 hr capsule Take 150 mg by mouth daily.    [provider]     Critical care time: 62 minutes      Venetia Night, AGACNP-BC Acute Care Nurse Practitioner [facility]   [phone] / [phone] Please see Amion for pager details.

## 2022-03-31 NOTE — ED Notes (Signed)
LKW was 1700, pt normally walks and talks. Pt was using all extremities than noted by staff at facility to have expressive aphasia and L arm was limp. Pt began moving L arm in CT ut is still aphasic. PT appears to understand speech. L arm is weaker than right.

## 2022-03-31 NOTE — ED Notes (Addendum)
Pt appears slightly more drowsy at this time, but opens eyes to voice, and follows basic commands. PT denies any headache at this time.

## 2022-03-31 NOTE — ED Notes (Signed)
Pt R Hand hematoma, extending to fingers, and entire hand. ICE and Ace bandage applied, elevated on pillow.

## 2022-03-31 NOTE — ED Notes (Signed)
CBG- 92 

## 2022-03-31 NOTE — ED Provider Notes (Addendum)
[facility] Provider Note    Event Date/Time   First MD Initiated Contact with Patient 03/31/22 1825     (approximate)  History   Chief Complaint: Code Stroke  HPI  [name] is a 81 y.o. female with a past medical history of anxiety, bipolar, presents from [place] with acute onset of difficulty speaking and reported left-sided deficits.  Code stroke activated upon arrival.  Per EMS report patient is coming from her nursing facility.  They state patient is normally ambulatory and talkative.  5 PM was last known normal.  They state shortly afterwards patient went to the cafeteria to eat where she was noted to be slumped over aphasic and appeared to have left upper extremity weakness brought emergently to the emergency department.  Here code stroke was activated upon arrival.  Neurology is seeing the patient.  Physical Exam   Triage Vital Signs: ED Triage Vitals [03/31/22 1833]  Enc Vitals Group     BP      Pulse      Resp      Temp      Temp src      SpO2      Weight 157 lb 6.5 oz (71.4 kg)     Height      Head Circumference      Peak Flow      Pain Score      Pain Loc      Pain Edu?      Excl. in [place]?     Most recent vital signs: There were no vitals filed for this visit.  General: Patient is awake, no acute distress.  Remains aphasic.  Will look into when talking to her CV:  Good peripheral perfusion.  Regular rate and rhythm  Resp:  Normal effort.  Equal breath sounds bilaterally.  Abd:  No distention.  Soft. Other:  Patient appears to be intermittently moving her left upper extremity although much less so than her right upper extremity.  Difficulty following commands making more neurologic exam difficult.  Remains aphasic.   ED Results / Procedures / Treatments   EKG  EKG viewed and interpreted by myself shows a normal sinus rhythm at 59 bpm with a narrow QRS, normal axis, prolonged PR interval otherwise reassuring intervals.   No concerning ST changes.  RADIOLOGY  I have reviewed the CT scan of the head no acute bleed seen on my evaluation. Radiology is read the head CT questionable acute or subacute infarct.  Also some older appearing infarct.   MEDICATIONS ORDERED IN ED: Medications  clevidipine (CLEVIPREX) infusion 0.5 mg/mL (has no administration in time range)  tenecteplase (TNKASE) injection for Stroke 18 mg (18 mg Intravenous Given 03/31/22 1845)  labetalol (NORMODYNE) injection 10 mg (10 mg Intravenous Given 03/31/22 1843)     IMPRESSION / MDM / ASSESSMENT AND PLAN / ED COURSE  I reviewed the triage vital signs and the nursing notes.  Patient's presentation is most consistent with acute presentation with potential threat to life or bodily function.  Patient presents to the emergency department as a code stroke.  Last known normal 5 PM presenting with aphasia and continued left upper extremity deficits.  Patient seen with neurology.  Neurology has spoken to the family and they would like to proceed with TNK.  Patient appears to be within the acceptable window.  Neurology discussed risk and benefits and family would like to proceed.  During my evaluation patient remains aphasic on  occasion will move her left upper extremity although much less so than right upper extremity.  NIH stroke scale of 16 per neurology.  We will check labs, patient has received blood pressure medication prior to TNK.  We will admit to the intensive care unit once labs have resulted.  Family agreeable to plan of care. Chemistry shows no concerning findings.  CBC normal.  I spoke to ICU who will be admitting the patient.   Patient noted to have a moderate-sized hematoma to the dorsal aspect of the right hand where she had an IV.  I instructed to place ice packs on the area Ace bandages for compression given the recent TNK administration.  ICU is currently evaluating the patient as well.  CRITICAL CARE Performed by: Harvest Dark   Total critical care time: 30 minutes  Critical care time was exclusive of separately billable procedures and treating other patients.  Critical care was necessary to treat or prevent imminent or life-threatening deterioration.  Critical care was time spent personally by me on the following activities: development of treatment plan with patient and/or surrogate as well as nursing, discussions with consultants, evaluation of patient's response to treatment, examination of patient, obtaining history from patient or surrogate, ordering and performing treatments and interventions, ordering and review of laboratory studies, ordering and review of radiographic studies, pulse oximetry and re-evaluation of patient's condition.   FINAL CLINICAL IMPRESSION(S) / ED DIAGNOSES   Acute CVA TNK administration   Note:  This document was prepared using Dragon voice recognition software and may include unintentional dictation errors.   Harvest Dark, MD 03/31/22 Jeananne Rama    Harvest Dark, MD 03/31/22 2042

## 2022-03-31 NOTE — ED Notes (Signed)
PT TO ICU with [facility] RN ED. Family at bedside

## 2022-03-31 NOTE — ED Notes (Signed)
Back from CT. Neurologist at bedside.

## 2022-04-01 ENCOUNTER — Encounter: Payer: Self-pay | Admitting: Radiology

## 2022-04-01 ENCOUNTER — Inpatient Hospital Stay: Payer: Medicare Other

## 2022-04-01 ENCOUNTER — Inpatient Hospital Stay
Admit: 2022-04-01 | Discharge: 2022-04-01 | Disposition: A | Discharge status: Home / Self Care | Payer: Medicare Other | Attending: Pulmonary Disease | Admitting: Pulmonary Disease

## 2022-04-01 DIAGNOSIS — I639 Cerebral infarction, unspecified: Secondary | ICD-10-CM | POA: Diagnosis not present

## 2022-04-01 LAB — GLUCOSE, CAPILLARY
Glucose-Capillary: 102 mg/dL — ABNORMAL HIGH (ref 70–99)
Glucose-Capillary: 106 mg/dL — ABNORMAL HIGH (ref 70–99)
Glucose-Capillary: 108 mg/dL — ABNORMAL HIGH (ref 70–99)
Glucose-Capillary: 93 mg/dL (ref 70–99)
Glucose-Capillary: 96 mg/dL (ref 70–99)
Glucose-Capillary: 99 mg/dL (ref 70–99)

## 2022-04-01 LAB — MRSA NEXT GEN BY PCR, NASAL: MRSA by PCR Next Gen: NOT DETECTED

## 2022-04-01 LAB — BASIC METABOLIC PANEL
Anion gap: 6 (ref 5–15)
BUN: 11 mg/dL (ref 8–23)
CO2: 24 mmol/L (ref 22–32)
Calcium: 8.8 mg/dL — ABNORMAL LOW (ref 8.9–10.3)
Chloride: 107 mmol/L (ref 98–111)
Creatinine, Ser: 0.65 mg/dL (ref 0.44–1.00)
GFR, Estimated: >60 mL/min (ref >60)
Glucose, Bld: 98 mg/dL (ref 70–99)
Potassium: 3.7 mmol/L (ref 3.5–5.1)
Sodium: 137 mmol/L (ref 135–145)

## 2022-04-01 LAB — HEMOGLOBIN A1C
Hgb A1c MFr Bld: 5 % (ref 4.8–5.6)
Mean Plasma Glucose: 96.8 mg/dL

## 2022-04-01 LAB — LIPID PANEL
Cholesterol: 189 mg/dL (ref 0–200)
HDL: 86 mg/dL (ref >40)
LDL Cholesterol: 89 mg/dL (ref 0–99)
Total CHOL/HDL Ratio: 2.2 RATIO
Triglycerides: 68 mg/dL (ref <150)
VLDL: 14 mg/dL (ref 0–40)

## 2022-04-01 LAB — CBC
HCT: 34.8 % — ABNORMAL LOW (ref 36.0–46.0)
Hemoglobin: 11.3 g/dL — ABNORMAL LOW (ref 12.0–15.0)
MCH: 29.3 pg (ref 26.0–34.0)
MCHC: 32.5 g/dL (ref 30.0–36.0)
MCV: 90.2 fL (ref 80.0–100.0)
Platelets: 232 10*3/uL (ref 150–400)
RBC: 3.86 MIL/uL — ABNORMAL LOW (ref 3.87–5.11)
RDW: 12.7 % (ref 11.5–15.5)
WBC: 9.7 10*3/uL (ref 4.0–10.5)
nRBC: 0 % (ref 0.0–0.2)

## 2022-04-01 LAB — PHOSPHORUS: Phosphorus: 2.4 mg/dL — ABNORMAL LOW (ref 2.5–4.6)

## 2022-04-01 LAB — MAGNESIUM: Magnesium: 2 mg/dL (ref 1.7–2.4)

## 2022-04-01 MED ORDER — PANTOPRAZOLE SODIUM 40 MG IV SOLR
40.0000 mg | INTRAVENOUS | Status: DC
  Administered 2022-04-01 – 2022-04-02 (×2): 40 mg via INTRAVENOUS
  Filled 2022-04-01 (×2): qty 10

## 2022-04-01 MED ORDER — ORAL CARE MOUTH RINSE
15.0000 mL | Freq: Two times a day (BID) | OROMUCOSAL | Status: DC
  Administered 2022-04-02 – 2022-04-04 (×5): 15 mL via OROMUCOSAL

## 2022-04-01 MED ORDER — CHLORHEXIDINE GLUCONATE CLOTH 2 % EX PADS
6.0000 | MEDICATED_PAD | Freq: Every day | CUTANEOUS | Status: DC
  Administered 2022-04-01 – 2022-04-05 (×5): 6 via TOPICAL

## 2022-04-01 MED ORDER — CHLORHEXIDINE GLUCONATE 0.12 % MT SOLN
15.0000 mL | Freq: Two times a day (BID) | OROMUCOSAL | Status: DC
  Administered 2022-04-01 – 2022-04-05 (×8): 15 mL via OROMUCOSAL
  Filled 2022-04-01 (×4): qty 15

## 2022-04-01 NOTE — Progress Notes (Signed)
NAME:  [name], MRN:  [id number], DOB:  1941/09/24, LOS: 1 ADMISSION DATE:  03/31/2022, CONSULTATION DATE:  03/31/22 REFERRING MD:  Dr. Kerman Passey, CHIEF COMPLAINT:  CODE STROKE  History of Present Illness:  81 year old female presenting to [facility] ED via EMS from [place] on 03/31/2022 for evaluation of acute onset difficulty speaking and reported left-sided deficits.  Per ED documentation, EMS reported the patient was last seen well at 1700 at her nursing facility by staff.  Shortly after this in the cafeteria she was noted to be slumped over, aphasic and appeared to have left upper extremity weakness.  Per her son bedside, the patient's baseline is talkative, ambulatory, with dementia requiring memory care and assistance with bathing only.  04/01/22- met with family of patient. She is with advanced dementia and we discussed GOC.  Holding anticoag for AF due to [facility].  S/p neurology evaluation.  Will transition to [facility].   Pertinent  Medical History  Anxiety Bipolar disorder Thyroid disease  Significant Hospital Events: Including procedures, antibiotic start and stop dates in addition to other pertinent events   03/31/2022: Admit to ICU as code stroke status post TNKase administration.  Interim History / Subjective:  Patient alert and responsive but aphasic.  Responds with first name with persistent stimulation and questioning, also able to say son's name.  No other verbal responses noted.  Patient able to follow some commands. Son and son's fianc present bedside, plan of care discussed all questions and concerns answered at this time  Objective   Blood pressure (!) 154/64, pulse 66, temperature (!) 97.4 F (36.3 C), resp. rate 17, height 5' 2"  (1.575 m), weight 70.2 kg, SpO2 98 %.        Intake/Output Summary (Last 24 hours) at 04/01/2022 1129 Last data filed at 04/01/2022 0700 Gross per 24 hour  Intake --  Output 550 ml  Net -550 ml   Filed Weights   03/31/22 1833 04/01/22  0500  Weight: 71.4 kg 70.2 kg    Examination: General: Adult female, acutely ill, lying in bed, NAD HEENT: MM pink/moist, anicteric, atraumatic, neck supple Neuro: A&O x 1, able to follow simple intermittent commands, PERRL +3, MAE CN: Challenging assessment while patient nonverbal and only following commands intermittently II - UTA III, IV, VI - Extraocular movements intact. V - UTA VII - Facial movement appears intact bilaterally (could not get the patient to smile) VIII - Hearing & vestibular appears intact bilaterally X - Palate elevates symmetrically XI - Chin turning & shoulder shrug appear intact bilaterally. XII - Tongue protrusion intact Motor Strength - strength 4/5 RUE & 2/5 LUE with pronator drift. RLE & LLE 3/5 with possible drift present.  Sensory - UTA Coordination -  UTA, Tremor was absent Gait and Station - deferred.  NIHSS Level of  Consciousness: 0 = Alert, keenly responsive; 1 = Not alert, but arousable by minor stimulation to obey, answer, or respond; 2 = Not alert, requires repeated stimulation to attend, or is obtunded and requires strong or painful stimulation to make movements (not stereotyped); 3 = Responds only with reflex motor or autonomic effects or totally unresponsive, flaccid, and areflexic   0  LOC Questions 0 = Answers both questions correctly; 1 = Answers one question correctly; 2 = Answers neither question correctly.  1  LOC Commands 0 = Performs both tasks correctly; 1 = Performs one task correctly; 2 = Performs neither task correctly  2  Best Gaze  0 = Normal; 1 =  Partial gaze palsy; gaze is abnormal in one or both eyes, but forced deviation or total gaze paresis is not present; 2 = Forced deviation, or total gaze paresis not overcome by the oculocephalic maneuver   0  Visual 0 = No visual loss; 1 = Partial hemianopia; 2 = Complete hemianopia; 3 = Bilateral hemianopia (blind including cortical blindness).   0  Facial Palsy 0 = Normal symmetrical  movements; 1 = Minor paralysis (flattened nasolabial fold, asymmetry on smiling); 2 = Partial paralysis (total or near-total paralysis of lower face); 3 = Complete paralysis of one or both sides (absence of facial movement in the upper and lower face).  0  Motor Arm LEFT 0 = No drift; limb holds 90 (or 45) degrees for full 10 secs; 1 = Drift; limb holds 90 (or 45) degrees, but drifts down before full 10 seconds; does not hit bed or other support; 2 = Some effort against gravity; limb cannot get to or maintain (if cued) 90 (or 45) degrees, drifts down to bed, but has some effort against gravity; 3 = No effort against gravity; limb falls; 4 = No movement; UN = unable to test (Amputation or joint fusion).   2  Motor Arm RIGHT 0 = No drift; limb holds 90 (or 45) degrees for full 10 secs; 1 = Drift; limb holds 90 (or 45) degrees, but drifts down before full 10 seconds; does not hit bed or other support; 2 = Some effort against gravity; limb cannot get to or maintain (if cued) 90 (or 45) degrees, drifts down to bed, but has some effort against gravity; 3 = No effort against gravity; limb falls; 4 = No movement; UN = unable to test (Amputation or joint fusion).   0  Motor Leg LEFT 0 = No drift; leg holds 30 degree position for full 5 secs; 1 = Drift; leg falls by the end of the 5-sec period but does not hit bed; 2 = Some effort against gravity; leg falls to bed by 5 secs, but has some effort against gravity; 3 = No effort against gravity; leg falls to bed immediately; 4 = No movement; UN = unable to test (Amputation or joint fusion).   1  Motor Leg RIGHT 0 = No drift; leg holds 30 degree position for full 5 secs; 1 = Drift; leg falls by the end of the 5-sec period but does not hit bed; 2 = Some effort against gravity; leg falls to bed by 5 secs, but has some effort against gravity; 3 = No effort against gravity; leg falls to bed immediately; 4 = No movement; UN = unable to test (Amputation or joint fusion).   1  Limb  Ataxia 0 = Absent; 1 = Present in one limb; 2 = Present in two limbs; UN = Amputation or joint fusion   0  Sensory  0 = Normal, no sensory loss; 1 = Mild-to-moderate sensory loss, patient feels pinprick is less sharp or is dull on the affected side, or there is a loss of superficial pain with pinprick, but patient is aware of being touched; 2 = Severe to total sensory loss, patient is not aware of being touched in the face, arm, and leg.   0  Best Language 0 = No aphasia; normal; 1 = Mild-to-moderate aphasia; some obvious loss of fluency or facility of comprehension, w/o significant limitation on ideas expressed or form of expression. Reduction of speech and/or comprehension, however, makes conversation about provided materials difficult or  impossible. For example, in conversation about provided materials, examiner can identify picture or naming card content from patient's response; 2 = Severe aphasia; all communication is through fragmentary expression; great need for inference, questioning, and guessing by the listener. Range of information that can be exchanged is limited; listener carries burden of communication. Examiner cannot identify materials provided from patient response; 3 = Mute, global aphasia; no usable speech or auditory comprehension  2  Dysarthria 0 = Normal; 1 = Mild-to-moderate dysarthria, patient slurs at least some words and, at worst, can be understood with some difficulty; 2 = Severe dysarthria, patient's speech is so slurred as to be unintelligible in the absence of or out of proportion to any dysphasia, or is mute/anarthric; UN = Intubated or other physical barrier  2  Extinction/Inattention 0 = No abnormality 1 = Visual, tactile, auditory, spatial, or personal extinction/inattention to bilateral simultaneous stimulation in one of the sensory modalities. 2 = Profound hemi-inattention or extinction to more than one modality; does not recognize own hand or orients to only one side of  space.  0  Total   11    CV: s1s2 RRR, NSR with 1st degree HB & RBBB on monitor, no r/m/g Pulm: Regular, non labored on RA , breath sounds clear-BUL & diminished-BLL GI: soft, rounded, non tender, bs x 4 Skin: no rashes/lesions noted Extremities: warm/dry, pulses + 2 R/P, no edema noted   Resolved Hospital Problem list     Assessment & Plan:  Acute Ischemic Stroke  suspect secondary to small vessel disease source TNKase given 18:45 03/31/22, LKW 17:00  - Modified NIHSS & vitals q 15 m x 2 h, q 30 m x 6 h, q 1 h x 16 h - NIHSS Q shift - perform bedside swallow > SLP eval if needed - PT/OT consult - Neurology consulted, appreciate input - f/u MRI brain, carotid US ordered - Echocardiogram ordered - f/u Lipid panel & Hgb A1c  - initiate statin once able to take PO medication -SCD's for VTE prophylaxis - follow ICU hypo/hyper-glycemia protocol - labetalol IV PRN to maintain BP < 180/105, utilize nicardipine gtt PRN  Hypothyroidism - hold PO medications currently, consider adjusting to IV synthroid if unable to take PO medications safely in next 3 days  Best Practice (right click and "Reselect all SmartList Selections" daily)  Diet/type: NPO DVT prophylaxis: SCD GI prophylaxis: PPI Lines: N/A Foley:  N/A Code Status:  DNR Last date of multidisciplinary goals of care discussion [03/31/22]  Labs   CBC: Recent Labs  Lab 03/31/22 1852 04/01/22 0458  WBC 7.6 9.7  NEUTROABS 5.5  --   HGB 11.9* 11.3*  HCT 37.7 34.8*  MCV 92.6 90.2  PLT 169 232     Basic Metabolic Panel: Recent Labs  Lab 03/31/22 1852 04/01/22 0458  NA 140 137  K 4.3 3.7  CL 105 107  CO2 26 24  GLUCOSE 107* 98  BUN 13 11  CREATININE 0.81 0.65  CALCIUM 8.8* 8.8*  MG  --  2.0  PHOS  --  2.4*    GFR: Estimated Creatinine Clearance: 50.6 mL/min (by C-G formula based on SCr of 0.65 mg/dL). Recent Labs  Lab 03/31/22 1852 04/01/22 0458  WBC 7.6 9.7     Liver Function Tests: Recent Labs   Lab 03/31/22 1852  AST 26  ALT 12  ALKPHOS 104  BILITOT 0.6  PROT 7.5  ALBUMIN 3.8    No results for input(s): LIPASE, AMYLASE in the last 168  hours. No results for input(s): AMMONIA in the last 168 hours.  ABG No results found for: PHART, PCO2ART, PO2ART, HCO3, TCO2, ACIDBASEDEF, O2SAT   Coagulation Profile: Recent Labs  Lab 03/31/22 1852  INR 1.0     Cardiac Enzymes: No results for input(s): CKTOTAL, CKMB, CKMBINDEX, TROPONINI in the last 168 hours.  HbA1C: Hgb A1c MFr Bld  Date/Time Value Ref Range Status  03/31/2022 06:52 PM 5.0 4.8 - 5.6 % Final    Comment:    (NOTE) Pre diabetes:          5.7%-6.4%  Diabetes:              >6.4%  Glycemic control for   <7.0% adults with diabetes     CBG: Recent Labs  Lab 03/31/22 2344 04/01/22 0309 04/01/22 0712 04/01/22 1116  GLUCAP 108* 108* 93 102*    Review of Systems:   UTA- patient unable to participate in interview  Past Medical History:  She,  has a past medical history of Anxiety, Bipolar 1 disorder ([place]), and Thyroid disease.   Surgical History:  No past surgical history on file.   Social History:   reports that she has never smoked. She has never used smokeless tobacco. She reports that she does not drink alcohol and does not use drugs.   Family History:  Her family history is not on file.   Allergies Allergies  Allergen Reactions   Codeine Phosphate [Codeine]     bulk   Shellfish Allergy      Home Medications  Prior to Admission medications   Medication Sig Start Date End Date Taking? Authorizing Provider  acetaminophen (TYLENOL) 325 MG tablet Take 650 mg by mouth every 4 (four) hours as needed for mild pain, fever or headache. Patient not taking: Reported on 09/18/2021    [provider]  ARIPiprazole (ABILIFY) 2 MG tablet Take 2 mg by mouth daily.    [provider]  aspirin 81 MG chewable tablet Chew 81 mg by mouth daily. Patient not taking: Reported on  09/18/2021    [provider]  Biotin 1000 MCG tablet Take 1,000 mcg by mouth 3 (three) times daily.    [provider]  busPIRone (BUSPAR) 15 MG tablet Take 15 mg by mouth 3 (three) times daily.     [provider]  cephALEXin (KEFLEX) 250 MG capsule Take 1 capsule (250 mg total) by mouth 3 (three) times daily. Patient not taking: Reported on 09/18/2021 07/18/19   Paulette Blanch, MD  cholecalciferol (VITAMIN D) 1000 units tablet Take 2,000 Units by mouth daily. Patient not taking: Reported on 09/18/2021    [provider]  docusate sodium (COLACE) 100 MG capsule Take 100 mg by mouth 2 (two) times daily.     [provider]  Ensure Plus (ENSURE PLUS) LIQD Take 237 mLs by mouth.    [provider]  Influenza Vac High-Dose Quad (FLUZONE HIGH-DOSE QUADRIVALENT IM) Inject 240 mcg into the muscle.    [provider]  levothyroxine (SYNTHROID, LEVOTHROID) 88 MCG tablet Take 88 mcg by mouth daily before breakfast.    [provider]  lisinopril (PRINIVIL,ZESTRIL) 5 MG tablet Take 5 mg by mouth daily. Patient not taking: Reported on 09/18/2021    [provider]  losartan (COZAAR) 100 MG tablet Take 100 mg by mouth daily. Patient not taking: Reported on 09/18/2021    [provider]  megestrol (MEGACE) 40 MG/ML suspension Take 400 mg by mouth daily.  Patient not taking: Reported on 09/18/2021    [provider]  Multiple Vitamins-Minerals (MULTIVITAMIN WITH MINERALS) tablet Take 1 tablet by mouth daily. Patient not taking: Reported on 09/18/2021    [provider]  nystatin [facility]) powder Apply 1 application topically daily.    [provider]  oxyCODONE (OXY IR/ROXICODONE) 5 MG immediate release tablet Take 5 mg by mouth every 4 (four) hours as needed for severe pain. Patient not taking: Reported on 09/18/2021    [provider]  polyethylene glycol (MIRALAX) packet Take 17 g by  mouth daily. Patient not taking: Reported on 09/18/2021 04/12/17   Nance Pear, MD  PSYLLIUM HUSK PO Take 1 capsule by mouth at bedtime.    [provider]  ranitidine (ZANTAC) 150 MG tablet Take 150 mg by mouth 2 (two) times daily. Patient not taking: Reported on 09/18/2021    [provider]  venlafaxine XR (EFFEXOR-XR) 150 MG 24 hr capsule Take 150 mg by mouth daily.    [provider]     Critical care provider statement:   Total critical care time: 33 minutes   Performed by: Lanney Gins MD   Critical care time was exclusive of separately billable procedures and treating other patients.   Critical care was necessary to treat or prevent imminent or life-threatening deterioration.   Critical care was time spent personally by me on the following activities: development of treatment plan with patient and/or surrogate as well as nursing, discussions with consultants, evaluation of patient's response to treatment, examination of patient, obtaining history from patient or surrogate, ordering and performing treatments and interventions, ordering and review of laboratory studies, ordering and review of radiographic studies, pulse oximetry and re-evaluation of patient's condition.    Ottie Glazier, M.D.  Pulmonary & Critical Care Medicine

## 2022-04-01 NOTE — Consult Note (Signed)
[facility] for Electrolyte Monitoring and Replacement   Recent Labs: Potassium (mmol/L)  Date Value  04/01/2022 3.7  01/26/2015 4.2   Magnesium (mg/dL)  Date Value  04/01/2022 2.0  01/20/2015 2.1   Calcium (mg/dL)  Date Value  04/01/2022 8.8 (L)   Calcium, Total (mg/dL)  Date Value  01/26/2015 9.2   Albumin (g/dL)  Date Value  03/31/2022 3.8  01/26/2015 3.7   Phosphorus (mg/dL)  Date Value  04/01/2022 2.4 (L)   Sodium (mmol/L)  Date Value  04/01/2022 137  01/26/2015 128 (L)   Assessment: Patient is an 81 y/o F with medical history including anxiety, bipolar disorder, thyroid disease who is admitted as code stroke s/p TNK. Pharmacy consulted to assist with electrolyte monitoring and replacement as indicated.  Goal of Therapy:  Electrolytes within normal limits  Plan:  --Phos mildly low at 2.4, continue to monitor --Follow-up electrolytes with AM labs tomorrow  Benita Gutter 04/01/2022 7:29 AM

## 2022-04-01 NOTE — Progress Notes (Signed)
Subjective: Awake but nonverbal except for calling her son's name with irritating stimuli.   Objective: Current vital signs: BP (!) 166/57   Pulse 82   Temp (!) 97.4 F (36.3 C)   Resp 20   Ht '5\' 2"'$  (1.575 m)   Wt 70.2 kg   SpO2 97%   BMI 28.31 kg/m  Vital signs in last 24 hours: Temp:  [97.4 F (36.3 C)-98.6 F (37 C)] 97.4 F (36.3 C) (06/04 0800) Pulse Rate:  [63-107] 82 (06/04 1100) Resp:  [16-27] 20 (06/04 1100) BP: (112-171)/(49-152) 166/57 (06/04 1100) SpO2:  [95 %-99 %] 97 % (06/04 1100) Weight:  [70.2 kg-71.4 kg] 70.2 kg (06/04 0500)  Intake/Output from previous day: 06/03 0701 - 06/04 0700 In: -  Out: 550 [Urine:550] Intake/Output this shift: No intake/output data recorded. Nutritional status:  Diet Order             Diet NPO time specified  Diet effective now                   HEENT: [place]/AT Lungs: Respirations unlabored Ext: Purple discoloration and swelling to right hand and forearm  Neurologic Exam: Ment: Awakens with stimulation, more so with noxious stimuli. Will follow simple motor command of squeezing hand on the right. Without stimuli, she becomes less alert. Nonverbal except for calling of her son's name.  CN: Left pupil slightly irregular, 5 mm and sluggishly reactive. Right pupil 3 mm and reactive. Will gaze at examiner's face and track visual stimuli. Face is symmetric at rest; does not follow command to smile.  Motor: LUE no movement to command or to pinch stimuli. Flaccid tone.  RUE: Grips examiner's hand to command.  LLE: Weak withdrawal to plantar stimulation RLE Weak withdrawal to plantar stimulation Sensory: Decreased reactivity to pinch of left arm. RUE and BLE with response to noxious stimuli Reflexes: Unchanged Cerebellar/Gait: Unable to assess  Lab Results: Results for orders placed or performed during the hospital encounter of 03/31/22 (from the past 48 hour(s))  Comprehensive metabolic panel     Status: Abnormal    Collection Time: 03/31/22  6:52 PM  Result Value Ref Range   Sodium 140 135 - 145 mmol/L   Potassium 4.3 3.5 - 5.1 mmol/L   Chloride 105 98 - 111 mmol/L   CO2 26 22 - 32 mmol/L   Glucose, Bld 107 (H) 70 - 99 mg/dL    Comment: Glucose reference range applies only to samples taken after fasting for at least 8 hours.   BUN 13 8 - 23 mg/dL   Creatinine, Ser 0.81 0.44 - 1.00 mg/dL   Calcium 8.8 (L) 8.9 - 10.3 mg/dL   Total Protein 7.5 6.5 - 8.1 g/dL   Albumin 3.8 3.5 - 5.0 g/dL   AST 26 15 - 41 U/L   ALT 12 0 - 44 U/L   Alkaline Phosphatase 104 38 - 126 U/L   Total Bilirubin 0.6 0.3 - 1.2 mg/dL   GFR, Estimated >60 >60 mL/min    Comment: (NOTE) Calculated using the CKD-EPI Creatinine Equation (2021)    Anion gap 9 5 - 15    Comment: Performed at [facility], [address] [place], [place] [address]  CBC with Differential     Status: Abnormal   Collection Time: 03/31/22  6:52 PM  Result Value Ref Range   WBC 7.6 4.0 - 10.5 K/uL   RBC 4.07 3.87 - 5.11 MIL/uL   Hemoglobin 11.9 (L) 12.0 - 15.0  g/dL   HCT 37.7 36.0 - 46.0 %   MCV 92.6 80.0 - 100.0 fL   MCH 29.2 26.0 - 34.0 pg   MCHC 31.6 30.0 - 36.0 g/dL   RDW 12.8 11.5 - 15.5 %   Platelets 169 150 - 400 K/uL   nRBC 0.0 0.0 - 0.2 %   Neutrophils Relative % 73 %   Neutro Abs 5.5 1.7 - 7.7 K/uL   Lymphocytes Relative 20 %   Lymphs Abs 1.5 0.7 - 4.0 K/uL   Monocytes Relative 7 %   Monocytes Absolute 0.5 0.1 - 1.0 K/uL   Eosinophils Relative 0 %   Eosinophils Absolute 0.0 0.0 - 0.5 K/uL   Basophils Relative 0 %   Basophils Absolute 0.0 0.0 - 0.1 K/uL   Immature Granulocytes 0 %   Abs Immature Granulocytes 0.03 0.00 - 0.07 K/uL    Comment: Performed at [facility], [address] [place], [place] [address]  APTT     Status: None   Collection Time: 03/31/22  6:52 PM  Result Value Ref Range   aPTT 24 24 - 36 seconds    Comment: Performed at [facility], [address] [place], [place] [address]   Protime-INR     Status: None   Collection Time: 03/31/22  6:52 PM  Result Value Ref Range   Prothrombin Time 13.0 11.4 - 15.2 seconds   INR 1.0 0.8 - 1.2    Comment: (NOTE) INR goal varies based on device and disease states. Performed at [facility], [address] [place], [place] [address]   Hemoglobin A1c     Status: None   Collection Time: 03/31/22  6:52 PM  Result Value Ref Range   Hgb A1c MFr Bld 5.0 4.8 - 5.6 %    Comment: (NOTE) Pre diabetes:          5.7%-6.4%  Diabetes:              >6.4%  Glycemic control for   <7.0% adults with diabetes    Mean Plasma Glucose 96.8 mg/dL    Comment: Performed at [facility] [address] [place], [place] [address]  MRSA Next Gen by PCR, Nasal     Status: None   Collection Time: 03/31/22 11:42 PM   Specimen: Nasal Mucosa; Nasal Swab  Result Value Ref Range   MRSA by PCR Next Gen NOT DETECTED NOT DETECTED    Comment: (NOTE) The GeneXpert MRSA Assay (FDA approved for NASAL specimens only), is one component of a comprehensive MRSA colonization surveillance program. It is not intended to diagnose MRSA infection nor to guide or monitor treatment for MRSA infections. Test performance is not FDA approved in patients less than 71 years old. Performed at [facility], [address] [place], [place] [address]   Glucose, capillary     Status: Abnormal   Collection Time: 03/31/22 11:44 PM  Result Value Ref Range   Glucose-Capillary 108 (H) 70 - 99 mg/dL    Comment: Glucose reference range applies only to samples taken after fasting for at least 8 hours.   Comment 1 Notify RN    Comment 2 Document in Chart   Glucose, capillary     Status: Abnormal   Collection Time: 04/01/22  3:09 AM  Result Value Ref Range   Glucose-Capillary 108 (H) 70 - 99 mg/dL    Comment: Glucose reference range applies only to samples taken after fasting for at least 8  hours.  Phosphorus     Status: Abnormal   Collection Time:  04/01/22  4:58 AM  Result Value Ref Range   Phosphorus 2.4 (L) 2.5 - 4.6 mg/dL    Comment: Performed at [facility], [address] [place], [place] [address]  Magnesium     Status: None   Collection Time: 04/01/22  4:58 AM  Result Value Ref Range   Magnesium 2.0 1.7 - 2.4 mg/dL    Comment: Performed at [facility], [address] [place], [place] [address]  Basic metabolic panel     Status: Abnormal   Collection Time: 04/01/22  4:58 AM  Result Value Ref Range   Sodium 137 135 - 145 mmol/L   Potassium 3.7 3.5 - 5.1 mmol/L   Chloride 107 98 - 111 mmol/L   CO2 24 22 - 32 mmol/L   Glucose, Bld 98 70 - 99 mg/dL    Comment: Glucose reference range applies only to samples taken after fasting for at least 8 hours.   BUN 11 8 - 23 mg/dL   Creatinine, Ser 0.65 0.44 - 1.00 mg/dL   Calcium 8.8 (L) 8.9 - 10.3 mg/dL   GFR, Estimated >60 >60 mL/min    Comment: (NOTE) Calculated using the CKD-EPI Creatinine Equation (2021)    Anion gap 6 5 - 15    Comment: Performed at [facility], [address] [place], [place] [address]  CBC     Status: Abnormal   Collection Time: 04/01/22  4:58 AM  Result Value Ref Range   WBC 9.7 4.0 - 10.5 K/uL   RBC 3.86 (L) 3.87 - 5.11 MIL/uL   Hemoglobin 11.3 (L) 12.0 - 15.0 g/dL   HCT 34.8 (L) 36.0 - 46.0 %   MCV 90.2 80.0 - 100.0 fL   MCH 29.3 26.0 - 34.0 pg   MCHC 32.5 30.0 - 36.0 g/dL   RDW 12.7 11.5 - 15.5 %   Platelets 232 150 - 400 K/uL   nRBC 0.0 0.0 - 0.2 %    Comment: Performed at [facility], [address] [place], [place] [address]  Lipid panel     Status: None   Collection Time: 04/01/22  4:58 AM  Result Value Ref Range   Cholesterol 189 0 - 200 mg/dL   Triglycerides 68 <150 mg/dL   HDL 86 >40 mg/dL   Total CHOL/HDL Ratio 2.2 RATIO   VLDL 14 0 - 40 mg/dL   LDL Cholesterol 89 0 - 99 mg/dL    Comment:        Total Cholesterol/HDL:CHD Risk Coronary Heart Disease Risk Table                      Men   Women  1/2 Average Risk   3.4   3.3  Average Risk       5.0   4.4  2 X Average Risk   9.6   7.1  3 X Average Risk  23.4   11.0        Use the calculated Patient Ratio above and the CHD Risk Table to determine the patient's CHD Risk.        ATP III CLASSIFICATION (LDL):  <100     mg/dL   Optimal  100-129  mg/dL   Near or Above                    Optimal  130-159  mg/dL  Borderline  160-189  mg/dL   High  >190     mg/dL   Very High Performed at [facility], [address] [place], [place] [address]   Glucose, capillary     Status: None   Collection Time: 04/01/22  7:12 AM  Result Value Ref Range   Glucose-Capillary 93 70 - 99 mg/dL    Comment: Glucose reference range applies only to samples taken after fasting for at least 8 hours.  Glucose, capillary     Status: Abnormal   Collection Time: 04/01/22 11:16 AM  Result Value Ref Range   Glucose-Capillary 102 (H) 70 - 99 mg/dL    Comment: Glucose reference range applies only to samples taken after fasting for at least 8 hours.    Recent Results (from the past 240 hour(s))  MRSA Next Gen by PCR, Nasal     Status: None   Collection Time: 03/31/22 11:42 PM   Specimen: Nasal Mucosa; Nasal Swab  Result Value Ref Range Status   MRSA by PCR Next Gen NOT DETECTED NOT DETECTED Final    Comment: (NOTE) The GeneXpert MRSA Assay (FDA approved for NASAL specimens only), is one component of a comprehensive MRSA colonization surveillance program. It is not intended to diagnose MRSA infection nor to guide or monitor treatment for MRSA infections. Test performance is not FDA approved in patients less than 78 years old. Performed at [facility], [address] [place], [place] [address]     Lipid Panel Recent Labs    04/01/22 0458  CHOL 189  TRIG 68  HDL 86  CHOLHDL 2.2  VLDL 14  LDLCALC 89    Studies/Results: CT HEAD WO CONTRAST (5MM)  Result Date: 03/31/2022 CLINICAL DATA:  Neuro deficit, acute,  stroke suspected. Left-sided weakness. Speech disturbance. EXAM: CT HEAD WITHOUT CONTRAST TECHNIQUE: Contiguous axial images were obtained from the base of the skull through the vertex without intravenous contrast. RADIATION DOSE REDUCTION: This exam was performed according to the departmental dose-optimization program which includes automated exposure control, adjustment of the mA and/or kV according to patient size and/or use of iterative reconstruction technique. COMPARISON:  05/22/2018 FINDINGS: Brain: Generalized atrophy. 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 Chronic small-vessel ischemic changes of the cerebral hemispheric white matter, progressive since 2019. Low-density in the right external capsule region could possibly be acute or subacute infarction. No mass, hemorrhage, obstructive hydrocephalus or extra-axial collection. Vascular: There is atherosclerotic calcification of the major vessels at the base of the brain. Skull: Negative Sinuses/Orbits: Clear/normal Other: None IMPRESSION: Question acute/subacute infarction in the right external capsule region. 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 Extensive chronic small-vessel ischemic changes elsewhere throughout the brain, progressive since 2019. These results were communicated to Dr. Cheral Marker at 6:26 pm on 03/31/2022 by text page via the [facility] messaging system. Electronically Signed   By: Nelson Chimes M.D.   On: 03/31/2022 18:32    Medications: Prior to Admission:  Medications Prior to Admission  Medication Sig Dispense Refill Last Dose   ARIPiprazole (ABILIFY) 2 MG tablet Take 2 mg by mouth every evening.   Past Week at Unknown   Biotin 1000 MCG tablet Take 1,000 mcg by mouth 3 (three) times daily.      busPIRone (BUSPAR) 15 MG tablet Take 15 mg by mouth 2 (two) times daily.   Past Week at Unknown   docusate sodium (COLACE) 100 MG capsule Take 100 mg by mouth  2 (two) times daily.       levothyroxine (SYNTHROID, LEVOTHROID) 88 MCG tablet Take 88 mcg by mouth daily before breakfast.   Past Week at Unknown   nystatin (MYCOSTATIN/NYSTOP) powder Apply 1 application. topically daily. (Apply under the breasts)      nystatin cream (MYCOSTATIN) Apply 1 application. topically 2 (two) times daily.      Psyllium 400 MG CAPS Take 400 mg by mouth at bedtime.      venlafaxine XR (EFFEXOR-XR) 150 MG 24 hr capsule Take 150 mg by mouth in the morning.   Past Week at Unknown   Scheduled:   stroke: early stages of recovery book   Does not apply Once   Chlorhexidine Gluconate Cloth  6 each Topical Daily   pantoprazole (PROTONIX) IV  40 mg Intravenous Q24H   Continuous:  niCARDipine      Assessment: 81 year old female with a history of dementia, resident of [facility] presenting with acute onset of aphasia and left hemiparesis 1. Exam reveals findings referable to the right MCA territory. She is left handed which may explain her aphasia in the context of right MCA stroke. NIHSS: 16. 2. CT head: Question acute/subacute infarction in the right external capsule region. 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 Extensive chronic small-vessel ischemic changes elsewhere throughout the brain, progressive since 2019. 3. EKG: Sinus rhythm; Prolonged PR interval; Right bundle branch block 4. TTE done with report pending.     Recommendations: 1. Admit to the ICU following administration of TNK  2. Continue with frequent neuro checks and BP management.  3. No antiplatelet medications or anticoagulants for at least 24 hours following TNK.  4. DVT prophylaxis with SCDs.  5. Will need to be started on a statin.  6. Will need to be restarted on antiplatelet therapy if follow up CT at 24 hours is negative for hemorrhagic conversion. Follow up CT has been ordered for 7 PM.  7. Cardiac telemetry  8.  PT/OT/Speech 9. MRI brain and CTA of head and neck when able to tolerate 10. NPO until passes swallow evaluation.   .    LOS: 1 day   '@Electronically'$  signed: Dr. Kerney Elbe 04/01/2022  12:46 PM

## 2022-04-01 NOTE — Progress Notes (Addendum)
3219  81 Year old demented female from [facility] unit. Questionable baseline mentation. Alert to self only. At baseline does not call her son by name. Refers to son's fiance' as "that girl."  Not consistent with responses to questions asked for neurological exams. Ability to move to command changes from hour to hour. Some of her responses are correct but not consistently correct.  Spontaneously moves left leg but not always when ask to move. At times she just stares when ask to do a task. Then at times she responds correctly. At baseline she can dress herself but requires help with bathing. Patient can feed herself but cannot prepare food. Before her admission to memory care she was in assisted living for 10 years per son's fiance'. Right arm hematoma remain with very little improvement. Right arm elevated all day. 1815 Down to CT for a repeat CT of head. Back in room.

## 2022-04-01 NOTE — Progress Notes (Signed)
SLP Cancellation Note  Patient Details Name: [name] MRN: [id number] DOB: [birth year]   Cancelled treatment:       Reason Eval/Treat Not Completed:  SLP consult received and appreciated. Noted consult timestamped for 04/01/22 @ 2052. Consult with RN. Per RN, pt will be appropriate for SLP evaluation next date. Noted TNK initiated @ 1845 on 03/31/22. Will f/u as appropriate.  Cherrie Gauze, M.S., [facility] [phone] [place])  Quintella Baton 04/01/2022, 10:03 AM

## 2022-04-01 NOTE — Progress Notes (Signed)
OT Cancellation Note  Patient Details Name: [name] MRN: [id number] DOB: Jan 14, 1941   Cancelled Treatment:    Reason Eval/Treat Not Completed: Medical issues which prohibited therapy  Thank you for OT consult.  Per chart review, patient was initiated on TNK at [facility] on 03/31/22.  Per therapy guidelines, patient is not appropriate to participate in therapy until 24-hours post TNK initiation.  Will continue to follow up when therapy is no longer contraindicated.  [name], OTR/L 04/01/22, 8:45 AM

## 2022-04-01 NOTE — Progress Notes (Signed)
New onset Atrial Fibrillation/Flutter rate controlled CHA2DS2-VASc score: 5 - systemic anticoagulation on hold due to TNKase administration - echocardiogram ordered - consider diltiazem for rate control if HR sustains > 120   Domingo Pulse Rust[place], AGACNP-BC Acute Care Nurse Practitioner [facility]   [phone] / [phone] Please see Amion for pager details.

## 2022-04-02 ENCOUNTER — Inpatient Hospital Stay: Payer: Medicare Other

## 2022-04-02 ENCOUNTER — Encounter: Payer: Self-pay | Admitting: Pulmonary Disease

## 2022-04-02 DIAGNOSIS — E039 Hypothyroidism, unspecified: Secondary | ICD-10-CM | POA: Diagnosis present

## 2022-04-02 DIAGNOSIS — E663 Overweight: Secondary | ICD-10-CM

## 2022-04-02 DIAGNOSIS — F319 Bipolar disorder, unspecified: Secondary | ICD-10-CM

## 2022-04-02 DIAGNOSIS — T82898A Other specified complication of vascular prosthetic devices, implants and grafts, initial encounter: Secondary | ICD-10-CM

## 2022-04-02 DIAGNOSIS — F0392 Unspecified dementia, unspecified severity, with psychotic disturbance: Secondary | ICD-10-CM

## 2022-04-02 DIAGNOSIS — I639 Cerebral infarction, unspecified: Secondary | ICD-10-CM | POA: Diagnosis not present

## 2022-04-02 LAB — BASIC METABOLIC PANEL
Anion gap: 9 (ref 5–15)
BUN: 14 mg/dL (ref 8–23)
CO2: 22 mmol/L (ref 22–32)
Calcium: 8.9 mg/dL (ref 8.9–10.3)
Chloride: 106 mmol/L (ref 98–111)
Creatinine, Ser: 0.79 mg/dL (ref 0.44–1.00)
GFR, Estimated: >60 mL/min (ref >60)
Glucose, Bld: 100 mg/dL — ABNORMAL HIGH (ref 70–99)
Potassium: 3.6 mmol/L (ref 3.5–5.1)
Sodium: 137 mmol/L (ref 135–145)

## 2022-04-02 LAB — PHOSPHORUS: Phosphorus: 3.4 mg/dL (ref 2.5–4.6)

## 2022-04-02 LAB — ECHOCARDIOGRAM COMPLETE
AR max vel: 1.72 cm2
AV Area VTI: 1.68 cm2
AV Area mean vel: 1.56 cm2
AV Mean grad: 12 mmHg
AV Peak grad: 20.6 mmHg
Ao pk vel: 2.27 m/s
Area-P 1/2: 5.5 cm2
Height: 62 in
P 1/2 time: 460 msec
S' Lateral: 1.6 cm
Weight: 2476.21 oz

## 2022-04-02 LAB — GLUCOSE, CAPILLARY
Glucose-Capillary: 92 mg/dL (ref 70–99)
Glucose-Capillary: 123 mg/dL — ABNORMAL HIGH (ref 70–99)
Glucose-Capillary: 90 mg/dL (ref 70–99)
Glucose-Capillary: 98 mg/dL (ref 70–99)
Glucose-Capillary: 105 mg/dL — ABNORMAL HIGH (ref 70–99)
Glucose-Capillary: 123 mg/dL — ABNORMAL HIGH (ref 70–99)
Glucose-Capillary: 115 mg/dL — ABNORMAL HIGH (ref 70–99)

## 2022-04-02 LAB — MAGNESIUM: Magnesium: 2 mg/dL (ref 1.7–2.4)

## 2022-04-02 LAB — CBC
HCT: 36 % (ref 36.0–46.0)
Hemoglobin: 11.8 g/dL — ABNORMAL LOW (ref 12.0–15.0)
MCH: 29.2 pg (ref 26.0–34.0)
MCHC: 32.8 g/dL (ref 30.0–36.0)
MCV: 89.1 fL (ref 80.0–100.0)
Platelets: 242 10*3/uL (ref 150–400)
RBC: 4.04 MIL/uL (ref 3.87–5.11)
RDW: 12.7 % (ref 11.5–15.5)
WBC: 11.1 10*3/uL — ABNORMAL HIGH (ref 4.0–10.5)
nRBC: 0 % (ref 0.0–0.2)

## 2022-04-02 MED ORDER — ASPIRIN 81 MG PO CHEW: 81.0000 mg | CHEWABLE_TABLET | Freq: Every day | ORAL | Status: DC

## 2022-04-02 MED ORDER — ASPIRIN 81 MG PO CHEW
81.0000 mg | CHEWABLE_TABLET | Freq: Every day | ORAL | Status: DC
  Filled 2022-04-02: qty 1

## 2022-04-02 MED ORDER — ATORVASTATIN CALCIUM 20 MG PO TABS
20.0000 mg | ORAL_TABLET | Freq: Every day | ORAL | Status: DC
  Administered 2022-04-02 – 2022-04-05 (×4): 20 mg via ORAL
  Filled 2022-04-02 (×3): qty 1

## 2022-04-02 MED ORDER — ENOXAPARIN SODIUM 40 MG/0.4ML IJ SOSY
40.0000 mg | PREFILLED_SYRINGE | INTRAMUSCULAR | Status: DC
Start: 2022-04-02 — End: 2022-04-02
  Filled 2022-04-02: qty 0.4

## 2022-04-02 MED ORDER — IOHEXOL 350 MG/ML SOLN
75.0000 mL | Freq: Once | INTRAVENOUS | Status: AC | PRN
  Administered 2022-04-02: 75 mL via INTRAVENOUS

## 2022-04-02 NOTE — Evaluation (Signed)
Physical Therapy Evaluation Patient Details Name: [name] MRN: [id number] DOB: Feb 05, 1941 Today's Date: 04/02/2022  History of Present Illness  Patient is a 81 year old female with a history of dementia, resident of [place] presenting with acute onset of aphasia and left hemiparesis. 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 Probable associated petechial hemorrhage (Heidelberg Classification 1B), but no significant mass effect.  Clinical Impression  Patient seen with OT for co-evaluation this date. Son at the bedside confirmed prior living arrangements and level of functional mobility. She was ambulatory at [facility] using her rollator prior to admission.  The patient was able to follow some commands today with multi-modal cues. She required +2 person assistance for bed mobility. Standing performed x 3 bouts with 2 person assistance. Unable to ambulate at this time due to weakness and limited standing tolerance. Pre-gait activity performed in standing. The patient is requiring significant increased assistance with mobility compared to her baseline.  Recommend to continue PT to maximize independence and facilitate return to prior level of function. SNF recommended at discharge.      Recommendations for follow up therapy are one component of a multi-disciplinary discharge planning process, led by the attending physician.  Recommendations may be updated based on patient status, additional functional criteria and insurance authorization.  Follow Up Recommendations Skilled nursing-short term rehab (<3 hours/day)    Assistance Recommended at Discharge Frequent or constant Supervision/Assistance  Patient can return home with the following  Two people to help with walking and/or transfers;Two people to help with bathing/dressing/bathroom;Help with stairs or ramp for entrance;Assist for transportation;Direct  supervision/assist for medications management;Assistance with feeding;Assistance with cooking/housework    Equipment Recommendations None recommended by PT  Recommendations for Other Services       Functional Status Assessment Patient has had a recent decline in their functional status and demonstrates the ability to make significant improvements in function in a reasonable and predictable amount of time.     Precautions / Restrictions Precautions Precautions: Fall Restrictions Weight Bearing Restrictions: No      Mobility  Bed Mobility Overal bed mobility: Needs Assistance Bed Mobility: Supine to Sit, Sit to Supine     Supine to sit: Total assist, +2 for physical assistance Sit to supine: Total assist, +2 for physical assistance        Transfers Overall transfer level: Needs assistance Equipment used: 2 person hand held assist Transfers: Sit to/from Stand Sit to Stand: Max assist, +2 physical assistance           General transfer comment: standing performed x 3 bouts with maximal cues and faciliation provided for correct technique. patient initially stand with bilateral feet plantarflexed with minimal heel weight bearing that progressed with subsequent standing trials. +2 person assistance is required with all standing efforts    Ambulation/Gait             Pre-gait activities: faciliation for upright posture and weight shifting to left for advancement of RLE. patient able to take one small side step with RLE, however unable to follow commands for safe progression of ambulation. cues for neutral foot positioning. activity tolerance limited by fatigue with standing tolerance of less than 1 minute with each of the three standing bouts General Gait Details: unable at this time.  Stairs            Wheelchair Mobility    Modified Rankin (Stroke Patients Only)  Balance Overall balance assessment: Needs assistance Sitting-balance support: No upper  extremity supported, Feet supported Sitting balance-Leahy Scale: Fair Sitting balance - Comments: Min guard with periods of Min A required for posterior lean with fatigue Postural control: Posterior lean Standing balance support: Bilateral upper extremity supported Standing balance-Leahy Scale: Zero Standing balance comment: Max A +2 person with heavy reliance on bed for posterior leg support                             Pertinent Vitals/Pain Pain Assessment Pain Assessment: Faces Faces Pain Scale: No hurt    Home Living Family/patient expects to be discharged to:: Assisted living                   Additional Comments: patient is from [facility]    Prior Function Prior Level of Function : Needs assist             Mobility Comments: Mod I using rollator ADLs Comments: assistance for bathing per report. patient is able to feed herself with set-up and does most of her bathing per son     Hand Dominance   Dominant Hand: Left    Extremity/Trunk Assessment   Upper Extremity Assessment Upper Extremity Assessment: LUE deficits/detail;RUE deficits/detail RUE Deficits / Details: edema and discoloration of hand noted. AROM shoulder to just below 90 degrees. unable to follow commands for formal MMT LUE Deficits / Details: trace movement noted. difficut to formally assess as patient unable to follow commands consistently    Lower Extremity Assessment Lower Extremity Assessment: RLE deficits/detail;LLE deficits/detail RLE Deficits / Details: difficulty achieving neutral position of ankle with PROM and plantarflexion position preferred. unable to formally MMT due to cognition. more spontaneous movement noted on RLE compared to LLE LLE Deficits / Details: difficulty achieving neutral position of ankle with PROM and plantarflexion position preferred. unable to formally MMT due to cognition. more spontaneous movement noted on RLE compared to LLE        Communication   Communication: HOH;Receptive difficulties;Expressive difficulties  Cognition Arousal/Alertness: Awake/alert Behavior During Therapy: Flat affect Overall Cognitive Status: History of cognitive impairments - at baseline                                 General Comments: patient does state her first name when asked. she required multi-modal cues with all activity and is able to follow commands some of the time.        General Comments General comments (skin integrity, edema, etc.): vitals monitored throughout session without significant change. son at the bedside during evaluation with all questions answered. discussed discharge recomemndations also.    Exercises     Assessment/Plan    PT Assessment Patient needs continued PT services  PT Problem List Decreased strength;Decreased range of motion;Decreased activity tolerance;Decreased balance;Decreased mobility;Decreased cognition;Decreased safety awareness;Impaired tone       PT Treatment Interventions Gait training;DME instruction;Functional mobility training;Therapeutic activities;Therapeutic exercise;Balance training;Neuromuscular re-education;Cognitive remediation;Patient/family education    PT Goals (Current goals can be found in the Care Plan section)  Acute Rehab PT Goals Patient Stated Goal: patient unable to participate with goal setting. son has the goal of getting to the appropriate rehab PT Goal Formulation: With family Time For Goal Achievement: 04/16/22 Potential to Achieve Goals: Fair    Frequency 7X/week     Co-evaluation   Reason for Co-Treatment: Complexity of the  patient's impairments (multi-system involvement);Necessary to address cognition/behavior during functional activity;To address functional/ADL transfers PT goals addressed during session: Mobility/safety with mobility OT goals addressed during session: ADL's and self-care       AM-PAC PT "6 Clicks" Mobility  Outcome  Measure Help needed turning from your back to your side while in a flat bed without using bedrails?: A Lot Help needed moving from lying on your back to sitting on the side of a flat bed without using bedrails?: Total Help needed moving to and from a bed to a chair (including a wheelchair)?: Total Help needed standing up from a chair using your arms (e.g., wheelchair or bedside chair)?: Total Help needed to walk in hospital room?: Total Help needed climbing 3-5 steps with a railing? : Total 6 Click Score: 7    End of Session Equipment Utilized During Treatment: Oxygen Activity Tolerance: Patient limited by fatigue Patient left: in bed;with call bell/phone within reach;with bed alarm set;with family/visitor present Nurse Communication: Mobility status PT Visit Diagnosis: Unsteadiness on feet (R26.81);Muscle weakness (generalized) (M62.81);Other abnormalities of gait and mobility (R26.89)    Time: 4492-0100 PT Time Calculation (min) (ACUTE ONLY): 26 min   Charges:   PT Evaluation $PT Eval Moderate Complexity: 1 Mod PT Treatments $Therapeutic Activity: 8-22 mins        Minna Merritts, PT, MPT   Percell Locus 04/02/2022, 12:10 PM

## 2022-04-02 NOTE — Consult Note (Signed)
PHARMACY CONSULT NOTE  Pharmacy Consult for Electrolyte Monitoring and Replacement   Recent Labs: Potassium (mmol/L)  Date Value  04/02/2022 3.6  01/26/2015 4.2   Magnesium (mg/dL)  Date Value  04/02/2022 2.0  01/20/2015 2.1   Calcium (mg/dL)  Date Value  04/02/2022 8.9   Calcium, Total (mg/dL)  Date Value  01/26/2015 9.2   Albumin (g/dL)  Date Value  03/31/2022 3.8  01/26/2015 3.7   Phosphorus (mg/dL)  Date Value  04/02/2022 3.4   Sodium (mmol/L)  Date Value  04/02/2022 137  01/26/2015 128 (L)   Assessment: Patient is an 81 y/o F with medical history including anxiety, bipolar disorder, thyroid disease who is admitted as code stroke s/p TNK. Pharmacy consulted to assist with electrolyte monitoring and replacement as indicated.  Goal of Therapy:  Electrolytes within normal limits  Plan:  --no electrolyte replacement warranted for today --Follow-up electrolytes with AM labs tomorrow  Dallie Piles 04/02/2022 9:33 AM

## 2022-04-02 NOTE — Plan of Care (Signed)
Continue plan of care.

## 2022-04-02 NOTE — Assessment & Plan Note (Signed)
Secondary to CVA.  Delirium appears to be resolved and patient appears to be close to baseline.  Continue to follow-up.  As mentioned above, if patient starts to decline, family is opting for comfort care.

## 2022-04-02 NOTE — Evaluation (Signed)
Clinical/Bedside Swallow Evaluation Patient Details  Name: [name] MRN: [id number] Date of Birth: [birth year]  Today's Date: 04/02/2022 Time: SLP Start Time (ACUTE ONLY): 31 SLP Stop Time (ACUTE ONLY): 1055 SLP Time Calculation (min) (ACUTE ONLY): 50 min  Past Medical History:  Past Medical History:  Diagnosis Date   Anxiety    Bipolar 1 disorder ([place])    Thyroid disease    Past Surgical History: No past surgical history on file. HPI:  Pt is a 81 year old female with past medical history of advanced Dementia and Large chronic hiatal hernia from memory care facility who was brought into the emergency room on 6/3 for acute onset speaking difficulties with left-sided deficits.  Patient had been observed less than an hour before at her baseline and then in the cafeteria soon after, noted to be slumped over w/ left upper extremity weakness and difficulty talking and brought in on 6/3 to the ED.  CT scan of head noted questionable acute/subacute infarction in the right external capsule area as well as an old infarct in the left posterior frontal region.  Patient felt to be candidate for thrombolysis and given TNK.  She was then admitted to the ICU.  Patient went for MRI of brain on 6/5 which was a limited study due to patient agitation but right MCA territory infarcts noted.  CT angiogram of head and neck done on 6/5 noted CT appearance of right MCA territory infarcts as demonstrated by MRI.    CXR: "Large chronic hiatal hernia. No acute cardiopulmonary  abnormality.".     Assessment / Plan / Recommendation  Clinical Impression  Pt appears to present w/ grossly adequate oropharyngeal phase swallow function in setting of new RIGHT CVA but w/ Baseline declined Cognitive status; advanced Dementia. Family has reported that pt's Dementia has been "worsening" in recent months prior to this admit. Cognitive decline can impact overall awareness/timing of swallow and safety during po tasks which  increases risk for aspiration, choking. In addition, pt is Edentulous for Upper Dentition; has some lower Denitition. Lacking Dentition for effective mastication can increase risk for choking on poorly chewed foods. Pt's risk for aspiration/choking can be reduced when following general aspiration precautions, supported w/ feeding assistance to monitor self-feeding d/t Cognitive decline, and when using a modified diet consistency d/t Edentulous status. During this evaluation, she required Mod verbal/visual/tactile cues for follow through during po tasks and self-feeding.            Pt consumed several trials of ice chips, purees, and thin liquids via cup w/ No overt clinical s/s of aspiration noted: no decline in vocal quality; no cough, and no decline in respiratory status during/post trials. O2 sats remained upper 90s throughout. Oral phase was adequate for bolus management and oral clearing of the boluses given. Pt attempted self-feeding but required Mod support and guidance d/t the Cognitive decline and the edema of the RUE. She was able to hold cup which improves safety of swallowing. OM Exam appeared [facility] w/ No unilateral weakness in lingual or labial movements; no asymmetry. Some confusion of OM tasks and oral care noted. Hand over hand guidance and visual cue were helpful to initiate tasks.             In setting of baseline Dementia/Cognitive decline, upper Edentulous status and missing lower Dentition, Large Hiatal Hernia, and her risk for aspiration and Reflux, recommend initiation of the dysphagia level 1(Puree foods) moistened for ease of oral phase/mastication w/ thin liquids via Cup;  general aspiration precautions; reduce Distractions during meals and engage pt during meals for self-feeding. Pills Crushed in Puree for safer swallowing as needed. Support w/ feeding at meals. MD/NSG updated.   ST services will monitor for appropriateness to upgrade diet consistency over next 1-2 days. ST services  recommends follow-up w/ Palliative Care for [place]; Dietician for nutrition support. Precautions posted in room. SLP Visit Diagnosis: Dysphagia, oral phase (R13.11) (Edentulous (upper); baseline Dementia)    Aspiration Risk  Mild aspiration risk;Risk for inadequate nutrition/hydration (reduced following precautions)    Diet Recommendation   dysphagia level 1(Puree foods) moistened for ease of oral phase/mastication w/ thin liquids via Cup; general aspiration precautions; reduce Distractions during meals and engage pt during meals for self-feeding. Support w/ feeding at meals.   Medication Administration: Crushed with puree (as able for safer swallowing)    Other  Recommendations Recommended Consults:  (Dietician f/u; Palliative Care for [place]) Oral Care Recommendations: Oral care BID;Oral care before and after PO;Staff/trained caregiver to provide oral care Other Recommendations:  (n/a)    Recommendations for follow up therapy are one component of a multi-disciplinary discharge planning process, led by the attending physician.  Recommendations may be updated based on patient status, additional functional criteria and insurance authorization.  Follow up Recommendations Follow physician's recommendations for discharge plan and follow up therapies      Assistance Recommended at Discharge Frequent or constant Supervision/Assistance (baseline Dementia; missing Dentition)  Functional Status Assessment Patient has had a recent decline in their functional status and/or demonstrates limited ability to make significant improvements in function in a reasonable and predictable amount of time  Frequency and Duration min 2x/week  1 week       Prognosis Prognosis for Safe Diet Advancement: Fair Barriers to Reach Goals: Cognitive deficits;Time post onset;Severity of deficits Barriers/Prognosis Comment: Large chronic hiatal hernia; missing Dentition      Swallow Study   General Date of Onset:  03/31/22 HPI: Pt is a 81 year old female with past medical history of advanced Dementia and Large chronic hiatal hernia from memory care facility who was brought into the emergency room on 6/3 for acute onset speaking difficulties with left-sided deficits.  Patient had been observed less than an hour before at her baseline and then in the cafeteria soon after, noted to be slumped over w/ left upper extremity weakness and difficulty talking and brought in on 6/3 to the ED.  CT scan of head noted questionable acute/subacute infarction in the right external capsule area as well as an old infarct in the left posterior frontal region.  Patient felt to be candidate for thrombolysis and given TNK.  She was then admitted to the ICU.  Patient went for MRI of brain on 6/5 which was a limited study due to patient agitation but right MCA territory infarcts noted.  CT angiogram of head and neck done on 6/5 noted CT appearance of right MCA territory infarcts as demonstrated by MRI.  CXR: "Large chronic hiatal hernia. No acute cardiopulmonary  abnormality.". Type of Study: Bedside Swallow Evaluation Previous Swallow Assessment: none Diet Prior to this Study: NPO Temperature Spikes Noted: No (wbc 11.1) Respiratory Status: Room air History of Recent Intubation: No Behavior/Cognition: Alert;Cooperative;Pleasant mood;Confused;Distractible;Requires cueing (baseline Dementia -- advancing in recent months per Son) Oral Cavity Assessment: Within Functional Limits Oral Care Completed by SLP: Yes Oral Cavity - Dentition: Missing dentition (no upper Dentition; few bottom Dentition) Vision: Functional for self-feeding Self-Feeding Abilities: Needs assist;Needs set up;Total assist (she helped to hold Cup)  Patient Positioning: Upright in bed (needed full positioning support) Baseline Vocal Quality: Low vocal intensity (adquate) Volitional Cough: Cognitively unable to elicit Volitional Swallow: Unable to elicit     Oral/Motor/Sensory Function Overall Oral Motor/Sensory Function: Within functional limits (no unilateral weakness noted during bolus management and clearing)   Ice Chips Ice chips: Within functional limits Presentation: Spoon (fed; 3 trials)   Thin Liquid Thin Liquid: Within functional limits Presentation: Cup;Self Fed (helped to support the cup w/ pt; 10 trials)    Nectar Thick Nectar Thick Liquid: Not tested   Honey Thick Honey Thick Liquid: Not tested   Puree Puree: Within functional limits Presentation: Spoon (fed; 8 trials)   Solid     Solid: Not tested Other Comments: missing most Dentition         Orinda Kenner, MS, CCC-SLP Speech Language Pathologist Rehab Services; [place] [phone] (ascom) Joakim Huesman 04/02/2022,4:51 PM

## 2022-04-02 NOTE — Assessment & Plan Note (Signed)
Resume Colace plus as needed MiraLAX

## 2022-04-02 NOTE — Hospital Course (Signed)
81 year old female with past medical history of dementia from assisted living in memory care unit who was brought into the emergency room on 6/3 for acute onset speaking with left-sided deficits.  Patient had been observed less than an hour before at her baseline and then in the cafeteria soon after, noted to be slumped over and aphasic with left upper extremity weakness and brought in on 6/3.  CT scan of head noted questionable acute/subacute infarction in the right external capsule area as well as an old infarct in the left posterior frontal region.  Patient felt to be candidate for thrombolysis and given TNK.  She was then admitted to the ICU.  Aphasia appeared to have resolved with difficulty determining in regards to extremity weakness.  Patient went for MRI of brain on 6/5 which was a limited study due to patient agitation but right MCA territory infarcts noted.  CT angiogram of head and neck done on 6/5 noted CT appearance of right MCA territory infarcts as demonstrated by MRI and no evidence of large vessel occlusion.  Hospital course complicated by right upper extremity extravasation injury from TNK.  Family understanding that if patient should decline, they are opting for full comfort care.

## 2022-04-02 NOTE — Assessment & Plan Note (Addendum)
Resume Synthroid. 

## 2022-04-02 NOTE — TOC Initial Note (Addendum)
Transition of Care [facility]) - Initial/Assessment Note    Patient Details  Name: [name] MRN: [id number] Date of Birth: 28-Oct-1941  Transition of Care [facility]) CM/SW Contact:    Shelbie Hutching, RN Phone Number: 04/02/2022, 10:29 AM  Clinical Narrative:                 Patient admitted to the hospital for ischemic stroke.  RNCM spoke with patient's son, [name], at the bedside, patient is currently in CT scan. Patient is from [facility] unit.  Patient has advanced dementia.  Son reports that patient has been living at [facility] for about a year and a half.  He says things are going pretty well but the patient may need a higher level of care.  Patient can normally walk with a walker, feed herself, dress herself and get to the bathroom by herself, she does require assistance with bathing.  He lives on [address] and would potentially like for patient to go to [facility].  PT, OT and Speech are ordered for evaluations.  Currently patient's SS check and special assistance cover her ALF cost.    TOC will follow up after therapy evaluations completed.    Expected Discharge Plan: Skilled Nursing Facility Barriers to Discharge: Continued Medical Work up   Patient Goals and CMS Choice Patient states their goals for this hospitalization and ongoing recovery are:: son is thinking that patient may need a higher level of care than Memory Care CMS Medicare.gov Compare Post Acute Care list provided to:: Patient Represenative (must comment) Choice offered to / list presented to : Adult Children  Expected Discharge Plan and Services Expected Discharge Plan: [place]   Discharge Planning Services: CM Consult Post Acute Care Choice: [place] Living arrangements for the past 2 months: [place]                 DME Arranged: N/A DME Agency: NA       HH Arranged: NA HH Agency: NA        Prior Living  Arrangements/Services Living arrangements for the past 2 months: [place] Lives with:: Facility Resident Patient language and need for interpreter reviewed:: Yes Do you feel safe going back to the place where you live?: Yes      Need for Family Participation in Patient Care: Yes (Comment) (stroke) Care giver support system in place?: Yes (comment) (son) Current home services: DME (walker) Criminal Activity/Legal Involvement Pertinent to Current Situation/Hospitalization: No - Comment as needed  Activities of Daily Living Home Assistive Devices/Equipment: Bedside commode/3-in-1, Shower chair with back, Walker (specify type), Blood pressure cuff, Raised toilet seat with rails ADL Screening (condition at time of admission) Patient's cognitive ability adequate to safely complete daily activities?: Yes Is the patient deaf or have difficulty hearing?: No Does the patient have difficulty seeing, even when wearing glasses/contacts?: No Does the patient have difficulty concentrating, remembering, or making decisions?: Yes Patient able to express need for assistance with ADLs?: No Does the patient have difficulty dressing or bathing?: Yes Independently performs ADLs?: No Communication: Dependent Is this a change from baseline?: Pre-admission baseline Dressing (OT): Needs assistance Is this a change from baseline?: Pre-admission baseline Grooming: Needs assistance Is this a change from baseline?: Pre-admission baseline Feeding: Needs assistance Is this a change from baseline?: Pre-admission baseline Bathing: Needs assistance Is this a change from baseline?: Pre-admission baseline Toileting: Needs assistance Is this a change from baseline?: Pre-admission baseline  In/Out Bed: Needs assistance Is this a change from baseline?: Pre-admission baseline Walks in Home: Needs assistance Is this a change from baseline?: Pre-admission baseline Does the patient have difficulty walking or  climbing stairs?: Yes Weakness of Legs: Both Weakness of Arms/Hands: Both  Permission Sought/Granted Permission sought to share information with : Case Manager, Customer service manager, Family Supports Permission granted to share information with : Yes, Verbal Permission Granted  Share Information with NAME: Emmilyn Crooke  Permission granted to share info w AGENCY: [employer], [place] granted to share info w Relationship: son  Permission granted to share info w Contact Information: [phone]  Emotional Assessment         Alcohol / Substance Use: Not Applicable Psych Involvement: No (comment)  Admission diagnosis:  Ischemic stroke ([place]) [I63.9] Cerebral infarction, unspecified mechanism ([place]) [I63.9] Patient Active Problem List   Diagnosis Date Noted   Ischemic stroke ([place]) 03/31/2022   Chronic constipation 03/01/2016   Bipolar 1 disorder ([place]) 01/26/2015   Dermatomyositis ([place]) 01/26/2015   Gastroesophageal reflux disease without esophagitis 01/26/2015   Anemia 04/02/2014   PCP:  Pcp, No Pharmacy:  No Pharmacies Listed    Social Determinants of Health (SDOH) Interventions    Readmission Risk Interventions     View : No data to display.

## 2022-04-02 NOTE — Assessment & Plan Note (Signed)
While patient was receiving TNK, IV infiltrated in right hand and she has evidence of significant discoloration and swelling.  Moving her right arm well.

## 2022-04-02 NOTE — Progress Notes (Signed)
Initial Nutrition Assessment  DOCUMENTATION CODES:   Not applicable  INTERVENTION:   RD will add supplements once pt's diet is advanced  NUTRITION DIAGNOSIS:   Inadequate oral intake related to acute illness (CVA) as evidenced by NPO status.  GOAL:   Patient will meet greater than or equal to 90% of their needs  MONITOR:   Diet advancement, Labs, Weight trends, Skin, I & O's  REASON FOR ASSESSMENT:   Malnutrition Screening Tool    ASSESSMENT:   81 y/o female with h/o bipolar 1 disorder, GERD, anxiety, depression and hypothyroidism who is admitted with CVA.  Met with pt in room today. Pt reports good appetite and oral intake pta. Pt currently NPO pending SLP evaluation. RD will add supplements and MVI once pt's diet is advanced. Pt reports that she drinks chocolate Ensure at home. Pt reports a 10lb recent weight loss. There is no recent documented weight history in chart to determine if any significant recent weight changes.   Medications reviewed and include: protonix  Labs reviewed: K 3.6 wnl, P 3.4 wnl, Mg 2.0 wnl Wbc- 11.1(H)  NUTRITION - FOCUSED PHYSICAL EXAM:  Flowsheet Row Most Recent Value  Orbital Region Moderate depletion  Upper Arm Region No depletion  Thoracic and Lumbar Region No depletion  Buccal Region No depletion  Temple Region Moderate depletion  Clavicle Bone Region No depletion  Clavicle and Acromion Bone Region No depletion  Scapular Bone Region No depletion  Dorsal Hand No depletion  Patellar Region No depletion  Anterior Thigh Region No depletion  Posterior Calf Region No depletion  Edema (RD Assessment) Mild  Hair Reviewed  Eyes Reviewed  Mouth Reviewed  Skin Reviewed  Nails Reviewed   Diet Order:   Diet Order             Diet NPO time specified  Diet effective now                  EDUCATION NEEDS:   Education needs have been addressed  Skin:  Skin Assessment: Reviewed RN Assessment  Last BM:  6/3  Height:   Ht  Readings from Last 1 Encounters:  04/01/22 _0  (1.575 m)    Weight:   Wt Readings from Last 1 Encounters:  04/02/22 66 kg    Ideal Body Weight:  50 kg  BMI:  Body mass index is 26.61 kg/m.  Estimated Nutritional Needs:   Kcal:  1400-1600kcal/day  Protein:  70-80g/day  Fluid:  1.4-1.6L/day  Koleen Distance MS, RD, LDN Please refer to [facility] for RD and/or RD on-call/weekend/after hours pager

## 2022-04-02 NOTE — Assessment & Plan Note (Signed)
Patient meets criteria BMI greater than 25 

## 2022-04-02 NOTE — Assessment & Plan Note (Signed)
Will restart home medications including Abilify and BuSpar and Effexor.

## 2022-04-02 NOTE — Progress Notes (Addendum)
Patient taken down for MRI  

## 2022-04-02 NOTE — Progress Notes (Signed)
[facility] Progress Note  Patient: [name]    UUV:[id number]  DOA: 03/31/2022    Date of Service: the patient was seen and examined on 04/02/2022  Brief hospital course: 81 year old female with past medical history of dementia from assisted living in memory care unit who was brought into the emergency room on 6/3 for acute onset speaking with left-sided deficits.  Patient had been observed less than an hour before at her baseline and then in the cafeteria soon after, noted to be slumped over and aphasic with left upper extremity weakness and brought in on 6/3.  CT scan of head noted questionable acute/subacute infarction in the right external capsule area as well as an old infarct in the left posterior frontal region.  Patient felt to be candidate for thrombolysis and given TNK.  She was then admitted to the ICU.  Aphasia appeared to have resolved with difficulty determining in regards to extremity weakness.  Patient went for MRI of brain on 6/5 which was a limited study due to patient agitation but right MCA territory infarcts noted.  CT angiogram of head and neck done on 6/5 noted CT appearance of right MCA territory infarcts as demonstrated by MRI and no evidence of large vessel occlusion.  Hospital course complicated by right upper extremity extravasation injury from TNK.  Family understanding that if patient should decline, they are opting for full comfort care.  Assessment and Plan: Assessment and Plan: * Ischemic stroke [facility]) Appreciate neurology and critical care help.  Status post TNK.  This has improved some of patient's symptoms although noted still some areas of infarction causing left upper extremity weakness.  Holding off on DVT prophylaxis and aspirin for now given extravasation injury.  See below.  Likely will need skilled nursing before/if patient returns back to assisted living.  Extravasation injury of IV catheter site with other complication, initial encounter  [facility]) While patient was receiving TNK, IV infiltrated in right hand and she has evidence of significant discoloration and swelling.  Monitoring closely.  Appears to be an area is improving.  No evidence of any tissue slough and does not appear to have affected her hand function.  Senile dementia with delirium without behavioral disturbance ([place]) Secondary to CVA.  Delirium appears to be resolved and patient appears to be close to baseline.  Continue to follow-up.  As mentioned above, if patient starts to decline, family is opting for comfort care.  Hypothyroidism Resume Synthroid  Bipolar 1 disorder (HCC) Will restart home medications including Abilify and BuSpar and Effexor.  Chronic constipation Resume Colace plus as needed MiraLAX  Overweight (BMI 25.0-29.9) Patient meets criteria BMI greater than 25       Body mass index is 26.61 kg/m.  Nutrition Problem: Inadequate oral intake Etiology: acute illness (CVA)     Consultants: Neurology Critical care  Procedures: Status post TNK administration Echocardiogram done 6/4 noting grade 1 diastolic dysfunction and preserved ejection fraction  Antimicrobials: None  Code Status: DNR   Subjective: Patient follows some commands  Objective: Noted some mildly elevated blood pressures and borderline tachypnea Vitals:   04/02/22 1400 04/02/22 1500  BP: (!) 144/77 (!) 145/80  Pulse: 93 88  Resp: (!) 28 (!) 21  Temp:    SpO2: 98% 98%    Intake/Output Summary (Last 24 hours) at 04/02/2022 1622 Last data filed at 04/02/2022 1500 Gross per 24 hour  Intake 268 ml  Output 800 ml  Net -532 ml   [employer]  03/31/22 1833 04/01/22 0500 04/02/22 0446  Weight: 71.4 kg 70.2 kg 66 kg   Body mass index is 26.61 kg/m.  Exam:  General: Oriented x1 HEENT: Normocephalic, atraumatic, mucous membranes slightly dry Cardiovascular: Regular rate and rhythm, S1-S2, 2 out of 6 systolic ejection murmur Respiratory: Clear to  auscultation bilaterally Abdomen: Soft, nontender, nondistended, positive bowel sounds Musculoskeletal: Following extravasation injury, patient's right hand is significantly swollen with some discoloration although not cyanotic Skin: As above Psychiatry: Underlying dementia, no evidence of acute psychoses Neurology: Decreased movement of left upper extremity  Data Reviewed: MRI and CTA as above White blood cell count with minimal increase.  Electrolytes stable.  Disposition:  Status is: Inpatient Remains inpatient appropriate because: Determination of disposition Stabilization status post TNK    Anticipated discharge date: 6/7  Family Communication: Son and daughter-in-law at the bedside DVT Prophylaxis: SCDs.  Due to extravasation injury, Lovenox on hold  Author: Annita Brod ,MD 04/02/2022 4:22 PM  To reach On-call, see care teams to locate the attending and reach out via [email or web address]. Between 7PM-7AM, please contact night-coverage If you still have difficulty reaching the attending provider, please page the [facility] (Director on Call) for [facility] on amion for assistance.

## 2022-04-02 NOTE — Progress Notes (Signed)
MRI complete. Patient was moving BL arms during MRI and was able to pull the protective helmet off using her right arm/hand. Patient was attempting to move/lift her left arm also but noticeably weaker on the left side. Patient also moving BL lower extremities during MRI. Upon arrival back to ICU patient would not move left arm on command.

## 2022-04-02 NOTE — Progress Notes (Signed)
SLP Cancellation Note  Patient Details Name: [name] MRN: [id number] DOB: 1940/12/19   Cancelled treatment:       Reason Eval/Treat Not Completed: SLP screened, no needs identified, will sign off (chart reviewed; consulted Son, NSG them met w/ pt) Pt has a baseline of Advanced Dementia. Currently, MRI is showing "Right MCA territory infarcts affecting the basal ganglia, right corona radiata, frontal operculum" w/ no other acute findings.  Suspect if any change in communication abilities, this might be more attributed to pt's Baseline Dementia and current Acute illness. Son stated pt's communication and overall status have been declining in the past few months prior to this admit.   Pt was able to answer basic, direct y/n questions re: self w/ intermittent accuracy. She was able to complete a familiar phrase/sentence w/ accuracy. She was not oriented to a distant family member/friend who was in the room but was oriented to Son.  Recommend ST services f/u post Discharge to next venue of care post acuity of illness and when pt can be seen in a more structured setting during ADLs to best assess communication abilities/needs. Son agreed. NSG updated.       Orinda Kenner, [place], [place] Speech Language Pathologist Rehab Services; [place] [phone] (ascom) Rosezetta Balderston 04/02/2022, 12:35 PM

## 2022-04-02 NOTE — Assessment & Plan Note (Signed)
Appreciate neurology and critical care help.  Status post TNK.  This has improved some of patient's symptoms although noted still some areas of infarction causing left upper extremity weakness.  Holding off on DVT prophylaxis and aspirin initially given extravasation injury.  Will restart aspirin.  See below.  She will need skilled nursing before/if patient returns back to assisted living.

## 2022-04-02 NOTE — Progress Notes (Signed)
Great day. Much improved neurologically. Trying to interact and carry on conversations. Movement with extremeties better Stood with PT.  Passed swallow evaluation. Eating pureed diet in small amounts. Right arm still bruised and swollen . Left AC bruised after CT Angio. Still very limited movent of left arm. Left leg is slightly weaker than right. Dr.Stack in to see patient. Concerned that left arm AC IV is also bruised. Lovenox and ASA discontinued. Patient recognized future daughter in law and smiled-1st time with facial expressions.

## 2022-04-02 NOTE — Evaluation (Signed)
Occupational Therapy Evaluation Patient Details Name: [name] MRN: [id number] DOB: [birth year] Today's Date: 04/02/2022   History of Present Illness 81 year old female presenting to [facility] ED via EMS from [place] on 03/31/2022 for evaluation of acute onset difficulty speaking and reported left-sided deficits.  Per ED documentation, EMS reported the patient was last seen well at 1700 at her nursing facility by staff.  Shortly after this in the cafeteria she was noted to be slumped over, aphasic and appeared to have left upper extremity weakness. 04/02/22 Brain MRI: Right MCA territory infarcts affecting the basal ganglia, right corona radiata, frontal operculum. Probable associated petechial hemorrhage, but no significant mass effect. Treated with TNK.   Clinical Impression   [name] was seen for OT/PT co- evaluation this date. Prior to hospital admission, pt was MOD I using 4WW and assist for bathing. Pt lives at [facility] unit. Pt presents to acute OT demonstrating impaired ADL performance and functional mobility 2/2 decreased activity tolerance, impaired use of dominant LUE, and functional strength/ROM/balance deficits. Pt states first name only, unable to state son's name. Pt correctly identifies a comb and demosntrates appropriate use.   Pt currently requires TOTAL A don B socks at bed level and sup<>sit. Tolerates sitting ~5 min, CGA decreasing to MIN A as pt fatigues. MOD A hand over hand combing ~20% of hair in sitting - assist for shoulder flexion. MAX A x2 for sit<>stand x3 standing trials, pt leaninig heavily on bed, limited by tight achilles - pt does not place B heels on floor. Son instructed in bed level therex. Pt would benefit from skilled OT to address noted impairments and functional limitations (see below for any additional details). Upon hospital discharge, recommend STR to maximize pt safety and return to PLOF.     Recommendations for follow up therapy are  one component of a multi-disciplinary discharge planning process, led by the attending physician.  Recommendations may be updated based on patient status, additional functional criteria and insurance authorization.   Follow Up Recommendations  Skilled nursing-short term rehab (<3 hours/day)    Assistance Recommended at Discharge Frequent or constant Supervision/Assistance  Patient can return home with the following Two people to help with walking and/or transfers;Two people to help with bathing/dressing/bathroom    Functional Status Assessment  Patient has had a recent decline in their functional status and demonstrates the ability to make significant improvements in function in a reasonable and predictable amount of time.  Equipment Recommendations  Other (comment) (defer to next venue of care)    Recommendations for Other Services       Precautions / Restrictions Precautions Precautions: Fall Restrictions Weight Bearing Restrictions: No      Mobility Bed Mobility Overal bed mobility: Needs Assistance Bed Mobility: Sit to Supine, Supine to Sit     Supine to sit: +2 for physical assistance, Total assist Sit to supine: Total assist, +2 for physical assistance        Transfers Overall transfer level: Needs assistance Equipment used: 2 person hand held assist Transfers: Sit to/from Stand Sit to Stand: Max assist, +2 physical assistance, From elevated surface           General transfer comment: x3 standing trials, pt leaninig heavily on bed. limited by tight achilles - pt does not place B heels on floor.      Balance Overall balance assessment: Needs assistance Sitting-balance support: No upper extremity supported, Feet supported Sitting balance-Leahy Scale: Fair Sitting balance - Comments: intermittently  CGA however MIN A as pt fatigues quickly Postural control: Posterior lean Standing balance support: Bilateral upper extremity supported Standing balance-Leahy  Scale: Zero                             ADL either performed or assessed with clinical judgement   ADL Overall ADL's : Needs assistance/impaired                                       General ADL Comments: MOD A hand over hand combing ~20% of hair in sitting - assist for shoulder flexion. TOTAL A don B socks at bed level. MAX A x2 for ADL t/f      Pertinent Vitals/Pain Pain Assessment Pain Assessment: Faces Faces Pain Scale: No hurt     Hand Dominance Left   Extremity/Trunk Assessment Upper Extremity Assessment Upper Extremity Assessment: LUE deficits/detail;RUE deficits/detail RUE Deficits / Details: 3/5 grossly, significant edema and hematoma noted LUE Deficits / Details: 1/5 grossly   Lower Extremity Assessment Lower Extremity Assessment: Defer to PT evaluation       Communication Communication Communication: HOH   Cognition Arousal/Alertness: Awake/alert Behavior During Therapy: Flat affect Overall Cognitive Status: History of cognitive impairments - at baseline                                 General Comments: states first name only, unable to state son's name. Pt states name of comb correctly and demosntrates appropriate use.                Home Living Family/patient expects to be discharged to:: Skilled nursing facility                                 Additional Comments: [facility] unit      Prior Functioning/Environment Prior Level of Function : Needs assist             Mobility Comments: MOD I using Rollator ADLs Comments: Assist for bathing and PRN assist for dressing/meal setup        OT Problem List: Decreased strength;Decreased range of motion;Decreased activity tolerance;Impaired balance (sitting and/or standing);Decreased safety awareness;Impaired UE functional use;Impaired sensation;Decreased cognition;Decreased coordination      OT Treatment/Interventions:  Self-care/ADL training;Therapeutic exercise;Energy conservation;DME and/or AE instruction;Therapeutic activities;Patient/family education;Balance training    OT Goals(Current goals can be found in the care plan section) Acute Rehab OT Goals Patient Stated Goal: to get better OT Goal Formulation: With family Time For Goal Achievement: 04/16/22 Potential to Achieve Goals: Fair ADL Goals Pt Will Perform Eating: with set-up;with supervision;bed level Pt Will Perform Grooming: with min guard assist;sitting Pt Will Transfer to Toilet: with min assist;with +2 assist;stand pivot transfer;bedside commode  OT Frequency: Min 2X/week    Co-evaluation              AM-PAC OT "6 Clicks" Daily Activity     Outcome Measure Help from another person eating meals?: A Lot Help from another person taking care of personal grooming?: A Lot Help from another person toileting, which includes using toliet, bedpan, or urinal?: Total Help from another person bathing (including washing, rinsing, drying)?: A Lot Help from another person to put on and taking off  regular upper body clothing?: A Lot Help from another person to put on and taking off regular lower body clothing?: Total 6 Click Score: 10   End of Session Nurse Communication: Mobility status  Activity Tolerance: Patient tolerated treatment well Patient left: in bed;with call bell/phone within reach;with bed alarm set;with family/visitor present  OT Visit Diagnosis: Other abnormalities of gait and mobility (R26.89);Muscle weakness (generalized) (M62.81);Hemiplegia and hemiparesis Hemiplegia - Right/Left: Left Hemiplegia - dominant/non-dominant: Dominant Hemiplegia - caused by: Cerebral infarction                Time: 9381-8299 OT Time Calculation (min): 27 min Charges:  OT General Charges $OT Visit: 1 Visit OT Evaluation $OT Eval Moderate Complexity: 1 Mod OT Treatments $Self Care/Home Management : 8-22 mins  Dessie Coma, M.S. OTR/L   04/02/22, 10:01 AM  ascom [phone]

## 2022-04-02 NOTE — Progress Notes (Signed)
Subjective: Dysarthria, dysphagia improved. Extravasation injury noted at TNK injection site distal RUE.  Interval data  CT head 24 hrs post TNK  Redemonstrated area of hypodensity in the right external capsule, possibly an acute or subacute infarct, with minimal new hyperdensity in this area, which could represent petechial hemorrhage. An MRI is recommended for further evaluation.  MRI brain wo contrast  1. Truncated and motion degraded exam due to patient agitation.   2. 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 Probable associated petechial hemorrhage (Heidelberg Classification 1B), but no significant mass effect.   3. No other acute finding evident. 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  CTA H&N  1. Negative for large vessel occlusion. Generalized arterial tortuosity but mild for age atherosclerosis in the head or neck with no significant stenosis   2. 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   3. Positive also for a nonspecific 8 mm polypoid filling defect in the thoracic trachea above the carina (series 11, image 189). This might be retained or aspirated secretions rather than a small airway mass, but bronchoscopy or clearing of the lesion on a Chest CT would be necessary to confirm.   4. Expected CT appearance of Right MCA territory infarcts demonstrated by MRI this morning. No malignant hemorrhagic transformation or mass effect.   5. Mild upper thoracic compression fracture at T3, age indeterminate but probably chronic.  CNS imaging personally reviewed; I agree with above interpretations  TTE - grade I diastolic dysfunction, no other sig abnl, no intracardiac clot  Stroke Labs     Component Value Date/Time   CHOL 189 04/01/2022 0458   TRIG 68 04/01/2022 0458   HDL 86 04/01/2022 0458   CHOLHDL 2.2 04/01/2022  0458   VLDL 14 04/01/2022 0458   LDLCALC 89 04/01/2022 0458    Lab Results  Component Value Date/Time   HGBA1C 5.0 03/31/2022 06:52 PM    Objective: Current vital signs: BP (!) 145/80   Pulse 88   Temp 98.5 F (36.9 C)   Resp (!) 21   Ht '5\' 2"'$  (1.575 m)   Wt 66 kg   SpO2 98%   BMI 26.61 kg/m  Vital signs in last 24 hours: Temp:  [98 F (36.7 C)-98.5 F (36.9 C)] 98.5 F (36.9 C) (06/05 1200) Pulse Rate:  [66-131] 88 (06/05 1500) Resp:  [15-29] 21 (06/05 1500) BP: (107-170)/(62-129) 145/80 (06/05 1500) SpO2:  [94 %-100 %] 98 % (06/05 1500) Weight:  [66 kg] 66 kg (06/05 0446)   HEENT: [place]/AT Lungs: Respirations unlabored Ext: Purple discoloration and swelling to right hand and forearm  Neurologic Exam: Ment: Awake, alert, oriented to self only CN: PERRL, blinks to threat bilat, EOMI, face symmetric, hearing intact to voice Motor: LUE no movement to command but 2/5 proximal movement spontaneously. RUE antigravity. Wiggles toes bilat Sensory: Decreased reactivity to pinch of left arm. RUE and BLE with response to noxious stimuli Reflexes: Unchanged Cerebellar/Gait: Unable to assess  Assessment: 81 year old L-handed female with a history of severe dementia, resident of [place] presenting with acute onset of aphasia and left hemiparesis 2/2 acute R MCA ischemic infarct with small amt petechial hemorrhage.   Recommendations: - Stroke workup complete - Please start ASA '81mg'$  daily and pharmacologic DVT prophylaxis when safe to do so in hospitalist's judgment (holding for now in setting of extensive extravasation injury) - Start ASA '81mg'$   daily (not DAPT in setting of petechial hemorrhage s/p TNK) - Start lovenox for DVT prophylaxis - Continue neurochecks, STAT head CT for any change in exam - Start atorvastatin '20mg'$  daily - Tele - PT/OT/SLP - I will arrange outpatient neuro f/u - Neurology to sign off but please re-engage if additional neurologic concerns  arise  Su Monks, MD [facility] [phone]  If [place], please page neurology on call as listed in [place].

## 2022-04-03 DIAGNOSIS — T82898A Other specified complication of vascular prosthetic devices, implants and grafts, initial encounter: Secondary | ICD-10-CM | POA: Diagnosis not present

## 2022-04-03 DIAGNOSIS — F0392 Unspecified dementia, unspecified severity, with psychotic disturbance: Secondary | ICD-10-CM | POA: Diagnosis not present

## 2022-04-03 DIAGNOSIS — I639 Cerebral infarction, unspecified: Secondary | ICD-10-CM | POA: Diagnosis not present

## 2022-04-03 DIAGNOSIS — E663 Overweight: Secondary | ICD-10-CM | POA: Diagnosis not present

## 2022-04-03 LAB — CBC
HCT: 37.8 % (ref 36.0–46.0)
Hemoglobin: 12.5 g/dL (ref 12.0–15.0)
MCH: 29.3 pg (ref 26.0–34.0)
MCHC: 33.1 g/dL (ref 30.0–36.0)
MCV: 88.5 fL (ref 80.0–100.0)
Platelets: 279 10*3/uL (ref 150–400)
RBC: 4.27 MIL/uL (ref 3.87–5.11)
RDW: 12.9 % (ref 11.5–15.5)
WBC: 12.6 10*3/uL — ABNORMAL HIGH (ref 4.0–10.5)
nRBC: 0 % (ref 0.0–0.2)

## 2022-04-03 LAB — PROCALCITONIN: Procalcitonin: <0.1 ng/mL

## 2022-04-03 LAB — BASIC METABOLIC PANEL WITH GFR
Anion gap: 9 (ref 5–15)
BUN: 18 mg/dL (ref 8–23)
CO2: 24 mmol/L (ref 22–32)
Calcium: 9.4 mg/dL (ref 8.9–10.3)
Chloride: 110 mmol/L (ref 98–111)
Creatinine, Ser: 0.79 mg/dL (ref 0.44–1.00)
GFR, Estimated: >60 mL/min
Glucose, Bld: 119 mg/dL — ABNORMAL HIGH (ref 70–99)
Potassium: 3.6 mmol/L (ref 3.5–5.1)
Sodium: 143 mmol/L (ref 135–145)

## 2022-04-03 LAB — MAGNESIUM: Magnesium: 2.3 mg/dL (ref 1.7–2.4)

## 2022-04-03 LAB — GLUCOSE, CAPILLARY
Glucose-Capillary: 114 mg/dL — ABNORMAL HIGH (ref 70–99)
Glucose-Capillary: 114 mg/dL — ABNORMAL HIGH (ref 70–99)
Glucose-Capillary: 118 mg/dL — ABNORMAL HIGH (ref 70–99)

## 2022-04-03 MED ORDER — PANTOPRAZOLE SODIUM 40 MG PO TBEC
40.0000 mg | DELAYED_RELEASE_TABLET | Freq: Every day | ORAL | Status: DC
  Administered 2022-04-03 – 2022-04-05 (×3): 40 mg via ORAL
  Filled 2022-04-03 (×3): qty 1

## 2022-04-03 MED ORDER — ARIPIPRAZOLE 2 MG PO TABS
2.0000 mg | ORAL_TABLET | Freq: Every evening | ORAL | Status: DC
  Administered 2022-04-03 – 2022-04-04 (×2): 2 mg via ORAL
  Filled 2022-04-03 (×4): qty 1

## 2022-04-03 MED ORDER — ASPIRIN 81 MG PO TBEC
81.0000 mg | DELAYED_RELEASE_TABLET | Freq: Every day | ORAL | Status: DC
  Administered 2022-04-03 – 2022-04-05 (×3): 81 mg via ORAL
  Filled 2022-04-03 (×3): qty 1

## 2022-04-03 MED ORDER — ADULT MULTIVITAMIN W/MINERALS CH
1.0000 | ORAL_TABLET | Freq: Every day | ORAL | Status: DC
  Administered 2022-04-04 – 2022-04-05 (×2): 1 via ORAL
  Filled 2022-04-03 (×2): qty 1

## 2022-04-03 MED ORDER — ENSURE ENLIVE PO LIQD
237.0000 mL | Freq: Three times a day (TID) | ORAL | Status: DC
  Administered 2022-04-03 – 2022-04-05 (×5): 237 mL via ORAL

## 2022-04-03 MED ORDER — VENLAFAXINE HCL ER 75 MG PO CP24
150.0000 mg | ORAL_CAPSULE | Freq: Every morning | ORAL | Status: DC
  Administered 2022-04-03 – 2022-04-05 (×3): 150 mg via ORAL
  Filled 2022-04-03 (×4): qty 2

## 2022-04-03 MED ORDER — BUSPIRONE HCL 10 MG PO TABS
15.0000 mg | ORAL_TABLET | Freq: Two times a day (BID) | ORAL | Status: DC
  Administered 2022-04-03 – 2022-04-05 (×4): 15 mg via ORAL
  Filled 2022-04-03 (×4): qty 2

## 2022-04-03 MED ORDER — LEVOTHYROXINE SODIUM 88 MCG PO TABS
88.0000 ug | ORAL_TABLET | Freq: Every day | ORAL | Status: DC
  Administered 2022-04-04 – 2022-04-05 (×2): 88 ug via ORAL
  Filled 2022-04-03 (×2): qty 1

## 2022-04-03 NOTE — Consult Note (Signed)
PHARMACY CONSULT NOTE  Pharmacy Consult for Electrolyte Monitoring and Replacement   Recent Labs: Potassium (mmol/L)  Date Value  04/03/2022 3.6  01/26/2015 4.2   Magnesium (mg/dL)  Date Value  04/03/2022 2.3  01/20/2015 2.1   Calcium (mg/dL)  Date Value  04/03/2022 9.4   Calcium, Total (mg/dL)  Date Value  01/26/2015 9.2   Albumin (g/dL)  Date Value  03/31/2022 3.8  01/26/2015 3.7   Phosphorus (mg/dL)  Date Value  04/02/2022 3.4   Sodium (mmol/L)  Date Value  04/03/2022 143  01/26/2015 128 (L)   Assessment: Patient is an 81 y/o F with medical history including anxiety, bipolar disorder, thyroid disease who is admitted as code stroke s/p TNK. Pharmacy consulted to assist with electrolyte monitoring and replacement as indicated.  Goal of Therapy:  Electrolytes within normal limits  Plan:  --No electrolyte replacement warranted for today --Patient care transferred from PCCM to [facility]. Will discontinue electrolyte consult at this time. Defer further ordering of labs and electrolyte replacement to primary team --Pharmacy will continue to monitor peripherally  [name] 04/03/2022 7:32 AM

## 2022-04-03 NOTE — NC FL2 (Signed)
[place] LEVEL OF CARE SCREENING TOOL     IDENTIFICATION  Patient Name: SHELBYLYNN WALCZYK Birthdate: 04-09-1941 Sex: female Admission Date (Current Location): 03/31/2022  [facility] and [place] Number:  Engineering geologist and Address:  [facility], [address] [place], [place] [address]      Provider Number: [id number]  Attending Physician Name and Address:  Annita Brod, MD  Relative Name and Phone Number:  Nevah Dalal- son- [phone]    Current Level of Care: Hospital Recommended Level of Care: [place] Prior Approval Number:    Date Approved/Denied:   PASRR Number: pending  Discharge Plan: SNF    Current Diagnoses: Patient Active Problem List   Diagnosis Date Noted   Hypothyroidism 04/02/2022   Overweight (BMI 25.0-29.9) 04/02/2022   Extravasation injury of IV catheter site with other complication, initial encounter ([place]) 04/02/2022   Senile dementia with delirium without behavioral disturbance ([place]) 04/02/2022   Ischemic stroke ([place]) 03/31/2022   Chronic constipation 03/01/2016   Bipolar 1 disorder ([place]) 01/26/2015   Dermatomyositis ([place]) 01/26/2015   Gastroesophageal reflux disease without esophagitis 01/26/2015   Anemia 04/02/2014    Orientation RESPIRATION BLADDER Height & Weight     Self  Normal External catheter Weight: 64.7 kg Height:  '5\' 2"'$  (157.5 cm)  BEHAVIORAL SYMPTOMS/MOOD NEUROLOGICAL BOWEL NUTRITION STATUS      Continent Diet (Dysphagia diet 1)  AMBULATORY STATUS COMMUNICATION OF NEEDS Skin   Extensive Assist Verbally Bruising, Other (Comment) (hematoma right upper quad)                       Personal Care Assistance Level of Assistance  Bathing, Feeding, Dressing Bathing Assistance: Maximum assistance Feeding assistance: Limited assistance Dressing Assistance: Maximum assistance     Functional Limitations Info             [place]  PT (By  licensed PT), OT (By licensed OT), Speech therapy     PT Frequency: 5 times per week OT Frequency: 5 times per week     Speech Therapy Frequency: min 2 times per week      Contractures Contractures Info: Not present    Additional Factors Info  Code Status, Allergies Code Status Info: DNR Allergies Info: Codeine, shellfish           Current Medications (04/03/2022):  This is the current hospital active medication list Current Facility-Administered Medications  Medication Dose Route Frequency Provider Last Rate Last Admin    stroke: early stages of recovery book   Does not apply Once Rust-Chester, Toribio Harbour L, NP       ARIPiprazole (ABILIFY) tablet 2 mg  2 mg Oral QPM Annita Brod, MD       atorvastatin (LIPITOR) tablet 20 mg  20 mg Oral Daily Su Monks M, MD   20 mg at 04/02/22 1555   busPIRone (BUSPAR) tablet 15 mg  15 mg Oral BID Annita Brod, MD       chlorhexidine (PERIDEX) 0.12 % solution 15 mL  15 mL Mouth Rinse BID Rust-Chester, Britton L, NP   15 mL at 04/02/22 2200   Chlorhexidine Gluconate Cloth 2 % PADS 6 each  6 each Topical Daily Rust-Chester, Britton L, NP   6 each at 04/02/22 1100   docusate sodium (COLACE) capsule 100 mg  100 mg Oral BID PRN Rust-Chester, Toribio Harbour L, NP       labetalol (NORMODYNE) injection 10 mg  10 mg Intravenous Q2H PRN Rust-Chester, Huel Cote, NP       [START ON 04/04/2022] levothyroxine (SYNTHROID) tablet 88 mcg  88 mcg Oral QAC breakfast Annita Brod, MD       MEDLINE mouth rinse  15 mL Mouth Rinse q12n4p Rust-Chester, Britton L, NP   15 mL at 04/02/22 1557   pantoprazole (PROTONIX) EC tablet 40 mg  40 mg Oral Daily Benita Gutter, RPH       polyethylene glycol (MIRALAX / GLYCOLAX) packet 17 g  17 g Oral Daily PRN Rust-Chester, Toribio Harbour L, NP       venlafaxine XR (EFFEXOR-XR) 24 hr capsule 150 mg  150 mg Oral q AM Annita Brod, MD         Discharge Medications: Please see discharge summary for a list of discharge  medications.  Relevant Imaging Results:  Relevant Lab Results:   Additional Information SS# [id number]  Shelbie Hutching, RN

## 2022-04-03 NOTE — Progress Notes (Signed)
PHARMACIST - PHYSICIAN COMMUNICATION  CONCERNING: IV to Oral Route Change Policy  RECOMMENDATION: This patient is receiving pantoprazole by the intravenous route.  Based on criteria approved by the Pharmacy and Therapeutics Committee, the intravenous medication(s) is/are being converted to the equivalent oral dose form(s).   DESCRIPTION: These criteria include: The patient is eating (either orally or via tube) and/or has been taking other orally administered medications for a least 24 hours The patient has no evidence of active gastrointestinal bleeding or impaired GI absorption (gastrectomy, short bowel, patient on TNA or NPO).  If you have questions about this conversion, please contact the [place], [facility] 04/03/2022 8:42 AM

## 2022-04-03 NOTE — Progress Notes (Signed)
To Whom it may concern:  Please be advised that the above named patient has a diagnosis of dementia which supercedes any psychiatric diagnoses.

## 2022-04-03 NOTE — Progress Notes (Signed)
Physical Therapy Treatment Patient Details Name: [name] MRN: [id number] DOB: 09/19/1941 Today's Date: 04/03/2022   History of Present Illness Patient is a 81 year old female with a history of dementia, resident of [place] presenting with acute onset of aphasia and left hemiparesis. 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 Probable associated petechial hemorrhage (Heidelberg Classification 1B), but no significant mass effect.    PT Comments    Pt alert and able to state her name, and half her birthday. Some preservation noted during conversation but with extra time and simple questions, pt able to answer ~80% of the time. Also noted to have improved LUE muscle activation today (able to attempt to lift on command, attempt to squeeze PT fingers.) She was able to perform heel slides AAROM bilaterally x10, and then with maxA, come up into sitting at EOB. CGA-modA to maintain seated balance, but able to perform some limited LAQ bilaterally as well. Sit <> Stand several times this session, totalAX2, and pt required bilateral knee block. Unable to take any steps today. Returned to supine with totalAx2. The patient would benefit from further skilled PT intervention to continue to progress towards goals. Recommendation remains appropriate.      Recommendations for follow up therapy are one component of a multi-disciplinary discharge planning process, led by the attending physician.  Recommendations may be updated based on patient status, additional functional criteria and insurance authorization.  Follow Up Recommendations  Skilled nursing-short term rehab (<3 hours/day)     Assistance Recommended at Discharge Frequent or constant Supervision/Assistance  Patient can return home with the following Two people to help with walking and/or transfers;Two people to help with bathing/dressing/bathroom;Help with stairs or  ramp for entrance;Assist for transportation;Direct supervision/assist for medications management;Assistance with feeding;Assistance with cooking/housework   Equipment Recommendations  None recommended by PT    Recommendations for Other Services       Precautions / Restrictions Precautions Precautions: Fall Restrictions Weight Bearing Restrictions: No     Mobility  Bed Mobility Overal bed mobility: Needs Assistance Bed Mobility: Supine to Sit, Sit to Supine     Supine to sit: Max assist Sit to supine: +2 for physical assistance, Total assist        Transfers Overall transfer level: Needs assistance Equipment used: 2 person hand held assist Transfers: Sit to/from Stand Sit to Stand: Max assist, +2 physical assistance           General transfer comment: standing attempted x4, bilateral knee block, pt unable to come up into fully standing    Ambulation/Gait               General Gait Details: unable at this time.   Stairs             Wheelchair Mobility    Modified Rankin (Stroke Patients Only)       Balance Overall balance assessment: Needs assistance Sitting-balance support: No upper extremity supported, Feet supported Sitting balance-Leahy Scale: Poor Sitting balance - Comments: min-modA with sitting Postural control: Posterior lean Standing balance support: Bilateral upper extremity supported Standing balance-Leahy Scale: Zero Standing balance comment: totalA +2 person with heavy reliance on bed for posterior leg support                            Cognition Arousal/Alertness: Awake/alert Behavior During Therapy: Flat affect Overall Cognitive Status: History of cognitive impairments -  at baseline                                          Exercises      General Comments        Pertinent Vitals/Pain Pain Assessment Pain Assessment: No/denies pain    Home Living                           Prior Function            PT Goals (current goals can now be found in the care plan section) Progress towards PT goals: Progressing toward goals    Frequency    7X/week      PT Plan Current plan remains appropriate    Co-evaluation              AM-PAC PT "6 Clicks" Mobility   Outcome Measure  Help needed turning from your back to your side while in a flat bed without using bedrails?: A Lot Help needed moving from lying on your back to sitting on the side of a flat bed without using bedrails?: Total Help needed moving to and from a bed to a chair (including a wheelchair)?: Total Help needed standing up from a chair using your arms (e.g., wheelchair or bedside chair)?: Total Help needed to walk in hospital room?: Total Help needed climbing 3-5 steps with a railing? : Total 6 Click Score: 7    End of Session Equipment Utilized During Treatment: Gait belt Activity Tolerance: Patient limited by fatigue Patient left: in bed;with call bell/phone within reach;with bed alarm set;with family/visitor present Nurse Communication: Mobility status PT Visit Diagnosis: Unsteadiness on feet (R26.81);Muscle weakness (generalized) (M62.81);Other abnormalities of gait and mobility (R26.89)     Time: 1941-7408 PT Time Calculation (min) (ACUTE ONLY): 20 min  Charges:  $Therapeutic Activity: 8-22 mins                     Lieutenant Diego PT, DPT 4:17 PM,04/03/22

## 2022-04-03 NOTE — Progress Notes (Signed)
Better day. More alert but also more restless. Held glass to drink water and tea at meals and when thirsty. Not as talkative today. Output marginal but ate more food during her mealtimes. Son in this evening. Able to take medications whole in ice cream.More movement noted in left arm and hand. Right arm extrasvation much less edematous and less purple in color.

## 2022-04-03 NOTE — Progress Notes (Signed)
Nutrition Follow Up Note   DOCUMENTATION CODES:   Not applicable  INTERVENTION:   Ensure Enlive po TID, each supplement provides 350 kcal and 20 grams of protein.  MVI po daily   NUTRITION DIAGNOSIS:   Inadequate oral intake related to acute illness (CVA) as evidenced by NPO status.  GOAL:   Patient will meet greater than or equal to 90% of their needs -progressing   MONITOR:   PO intake, Supplement acceptance, Labs, Weight trends, Skin, I & O's  ASSESSMENT:   81 y/o female with h/o bipolar 1 disorder, GERD, anxiety, depression and hypothyroidism who is admitted with CVA.  Pt seen by SLP and initiated on a dysphagia 1/thin liquid diet yesterday. Pt eating ~30% of meals in hospital. RD will add supplements and MVI to help pt meet her estimated needs. Per chart, pt is down ~15lbs since admit; pt -950m on her I & Os.   Medications reviewed and include: synthroid, MVI, protonix  Labs reviewed: K 3.6 wnl, P 3.4 wnl, Mg 2.3 wnl Wbc- 12.6(H) Cbgs- 114, 114, 118 x 24 hrs  Diet Order:   Diet Order             DIET - DYS 1 Room service appropriate? Yes with Assist; Fluid consistency: Thin  Diet effective now                  EDUCATION NEEDS:   Education needs have been addressed  Skin:  Skin Assessment: Reviewed RN Assessment  Last BM:  6/5  Height:   Ht Readings from Last 1 Encounters:  04/01/22 '5\' 2"'$  (1.575 m)    Weight:   Wt Readings from Last 1 Encounters:  04/03/22 64.7 kg    Ideal Body Weight:  50 kg  BMI:  Body mass index is 26.09 kg/m.  Estimated Nutritional Needs:   Kcal:  1400-1600kcal/day  Protein:  70-80g/day  Fluid:  1.4-1.6L/day  CKoleen DistanceMS, RD, LDN Please refer to [facility] and/or RD on-call/weekend/after hours pager

## 2022-04-03 NOTE — Progress Notes (Signed)
Occupational Therapy Treatment Patient Details Name: [name] MRN: [id number] DOB: 1941-03-13 Today's Date: 04/03/2022   History of present illness Patient is a 81 year old female with a history of dementia, resident of [place] presenting with acute onset of aphasia and left hemiparesis. 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 Probable associated petechial hemorrhage (Heidelberg Classification 1B), but no significant mass effect.   OT comments  [name] was seen for OT treatment on this date. Upon arrival to room pt reclined in bed, smiles and says hello, states name accurately. PT requires TOTAL A for bed mobility. MAX A x2 hand over hand hair combing and self-drinking using dominant LUE in sitting, +2 for sitting balance. Will continue to follow POC. Discharge recommendation remains appropriate.     Recommendations for follow up therapy are one component of a multi-disciplinary discharge planning process, led by the attending physician.  Recommendations may be updated based on patient status, additional functional criteria and insurance authorization.    Follow Up Recommendations  Skilled nursing-short term rehab (<3 hours/day)    Assistance Recommended at Discharge Frequent or constant Supervision/Assistance  Patient can return home with the following  Two people to help with walking and/or transfers;Two people to help with bathing/dressing/bathroom   Equipment Recommendations  Other (comment) (defer)    Recommendations for Other Services      Precautions / Restrictions Precautions Precautions: Fall Restrictions Weight Bearing Restrictions: No       Mobility Bed Mobility Overal bed mobility: Needs Assistance Bed Mobility: Rolling, Supine to Sit, Sit to Supine Rolling: Max assist   Supine to sit: Total assist, +2 for physical assistance Sit to supine: Total assist, +2 for physical  assistance        Transfers                   General transfer comment: did not attempt, zero sitting balance     Balance Overall balance assessment: Needs assistance Sitting-balance support: No upper extremity supported, Feet supported Sitting balance-Leahy Scale: Fair Sitting balance - Comments: poor righting reactions                                   ADL either performed or assessed with clinical judgement   ADL Overall ADL's : Needs assistance/impaired                                       General ADL Comments: MAX A x2 hand over hand hair combing and self-drinking in sitting, +2 for sitting balance.      Cognition Arousal/Alertness: Awake/alert Behavior During Therapy: Flat affect Overall Cognitive Status: History of cognitive impairments - at baseline                                 General Comments: states name and responds hello/bye. uses remote as phone                   Pertinent Vitals/ Pain       Pain Assessment Pain Assessment: No/denies pain   Frequency  Min 2X/week        Progress Toward Goals  OT Goals(current goals can now be found in the care  plan section)  Progress towards OT goals: Progressing toward goals  Acute Rehab OT Goals Patient Stated Goal: unable to state OT Goal Formulation: Patient unable to participate in goal setting Time For Goal Achievement: 04/16/22 Potential to Achieve Goals: Fair ADL Goals Pt Will Perform Eating: with set-up;with supervision;bed level Pt Will Perform Grooming: with min guard assist;sitting Pt Will Transfer to Toilet: with min assist;with +2 assist;stand pivot transfer;bedside commode  Plan Discharge plan remains appropriate;Frequency remains appropriate    Co-evaluation                 AM-PAC OT "6 Clicks" Daily Activity     Outcome Measure   Help from another person eating meals?: A Lot Help from another person taking care of  personal grooming?: A Lot Help from another person toileting, which includes using toliet, bedpan, or urinal?: Total Help from another person bathing (including washing, rinsing, drying)?: A Lot Help from another person to put on and taking off regular upper body clothing?: A Lot Help from another person to put on and taking off regular lower body clothing?: Total 6 Click Score: 10    End of Session    OT Visit Diagnosis: Other abnormalities of gait and mobility (R26.89);Muscle weakness (generalized) (M62.81);Hemiplegia and hemiparesis Hemiplegia - Right/Left: Left Hemiplegia - dominant/non-dominant: Dominant Hemiplegia - caused by: Cerebral infarction   Activity Tolerance Patient limited by fatigue   Patient Left in bed;with call bell/phone within reach;with bed alarm set   Nurse Communication Mobility status        Time: 1457-1520 OT Time Calculation (min): 23 min  Charges: OT General Charges $OT Visit: 1 Visit OT Treatments $Self Care/Home Management : 23-37 mins  Dessie Coma, M.S. OTR/L  04/03/22, 4:11 PM  ascom [phone]

## 2022-04-03 NOTE — Progress Notes (Signed)
[facility] Progress Note  Patient: [name]    HLK:[id number]  DOA: 03/31/2022    Date of Service: the patient was seen and examined on 04/03/2022  Brief hospital course: 81 year old female with past medical history of dementia from assisted living in memory care unit who was brought into the emergency room on 6/3 for acute onset speaking with left-sided deficits.  Patient had been observed less than an hour before at her baseline and then in the cafeteria soon after, noted to be slumped over and aphasic with left upper extremity weakness and brought in on 6/3.  CT scan of head noted questionable acute/subacute infarction in the right external capsule area as well as an old infarct in the left posterior frontal region.  Patient felt to be candidate for thrombolysis and given TNK.  She was then admitted to the ICU.  Aphasia appeared to have resolved with difficulty determining in regards to extremity weakness.  Patient went for MRI of brain on 6/5 which was a limited study due to patient agitation but right MCA territory infarcts noted.  CT angiogram of head and neck done on 6/5 noted CT appearance of right MCA territory infarcts as demonstrated by MRI and no evidence of large vessel occlusion.  Hospital course complicated by right upper extremity extravasation injury from TNK.  Family understanding that if patient should decline, they are opting for full comfort care.  Patient able to work with physical therapy, noting some weakness on left side  Assessment and Plan: Assessment and Plan: * Ischemic stroke [facility]) Appreciate neurology and critical care help.  Status post TNK.  This has improved some of patient's symptoms although noted still some areas of infarction causing left upper extremity weakness.  Holding off on DVT prophylaxis and aspirin initially given extravasation injury.  Will restart aspirin.  See below.  She will need skilled nursing before/if patient returns back to assisted  living.  Extravasation injury of IV catheter site with other complication, initial encounter [facility]) While patient was receiving TNK, IV infiltrated in right hand and she has evidence of significant discoloration and swelling.  Monitoring closely.  Significantly improved from previous day.  No evidence of any tissue slough and does not appear to have affected her hand function.  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  Delirium appears to be resolved and patient appears to be close to baseline.  Continue to follow-up.  As mentioned above, if patient starts to decline, family is opting for comfort care.  Hypothyroidism Resume Synthroid  Bipolar 1 disorder (HCC) Will restart home medications including Abilify and BuSpar and Effexor.  Chronic constipation Resume Colace plus as needed MiraLAX  Overweight (BMI 25.0-29.9) Patient meets criteria BMI greater than 25       Body mass index is 26.09 kg/m.  Nutrition Problem: Inadequate oral intake Etiology: acute illness (CVA)     Consultants: Neurology Critical care  Procedures: Status post TNK administration Echocardiogram done 6/4 noting grade 1 diastolic dysfunction and preserved ejection fraction  Antimicrobials: None  Code Status: DNR   Subjective: Patient follows commands, responds appropriately  Objective: Noted some mildly elevated blood pressures and borderline tachypnea Vitals:   04/03/22 1100 04/03/22 1200  BP:  (!) 191/90  Pulse: 61 (!) 54  Resp: (!) 25 (!) 25  Temp:  98.8 F (37.1 C)  SpO2: 97% 93%    Intake/Output Summary (Last 24 hours) at 04/03/2022 1718 Last data filed at 04/03/2022 0900 Gross per 24  hour  Intake 100 ml  Output 100 ml  Net 0 ml    Filed Weights   04/01/22 0500 04/02/22 0446 04/03/22 0430  Weight: 70.2 kg 66 kg 64.7 kg   Body mass index is 26.09 kg/m.  Exam:  General: Oriented x1 HEENT: Normocephalic, atraumatic, mucous membranes  slightly dry Cardiovascular: Regular rate and rhythm, S1-S2, 2 out of 6 systolic ejection murmur Respiratory: Clear to auscultation bilaterally Abdomen: Soft, nontender, nondistended, positive bowel sounds Musculoskeletal: Following extravasation injury, patient's right hand is still significantly bruised, but swelling has much improved Skin: As above Psychiatry: Underlying dementia, no evidence of acute psychoses Neurology: Decreased movement of left upper extremity  Data Reviewed: White blood cell count with mildly increased to 12.6.  Other lab work unremarkable  Disposition:  Status is: Inpatient Remains inpatient appropriate because: Needing for skilled nursing  Anticipated discharge date: 6/7  Family Communication: Son and daughter-in-law at the bedside DVT Prophylaxis: SCDs.  Due to extravasation injury, Lovenox on hold  Author: Annita Brod ,MD 04/03/2022 5:18 PM  To reach On-call, see care teams to locate the attending and reach out via [email or web address]. Between 7PM-7AM, please contact night-coverage If you still have difficulty reaching the attending provider, please page the [facility] (Director on Call) for [facility] on amion for assistance.

## 2022-04-03 NOTE — TOC Progression Note (Signed)
Transition of Care [facility]) - Progression Note    Patient Details  Name: [name] MRN: [id number] Date of Birth: 12/31/1940  Transition of Care [facility]) CM/SW Contact  Shelbie Hutching, RN Phone Number: 04/03/2022, 4:04 PM  Clinical Narrative:    MD believes that patient may be ready for discharge tomorrow.  [facility] has offered a bed, bed offer accepted.  Patient's son is going to tour the facility.  Pasrr is pending.  Starting insurance authorization.     Expected Discharge Plan: [place] Barriers to Discharge: Continued Medical Work up  Expected Discharge Plan and Services Expected Discharge Plan: [place]   Discharge Planning Services: CM Consult Post Acute Care Choice: [place] Living arrangements for the past 2 months: Assisted Living Facility                 DME Arranged: N/A DME Agency: NA       HH Arranged: NA HH Agency: NA         Social Determinants of Health (SDOH) Interventions    Readmission Risk Interventions     View : No data to display.

## 2022-04-04 DIAGNOSIS — I639 Cerebral infarction, unspecified: Secondary | ICD-10-CM | POA: Diagnosis not present

## 2022-04-04 DIAGNOSIS — E039 Hypothyroidism, unspecified: Secondary | ICD-10-CM

## 2022-04-04 DIAGNOSIS — K5909 Other constipation: Secondary | ICD-10-CM

## 2022-04-04 DIAGNOSIS — T82898A Other specified complication of vascular prosthetic devices, implants and grafts, initial encounter: Secondary | ICD-10-CM | POA: Diagnosis not present

## 2022-04-04 DIAGNOSIS — I480 Paroxysmal atrial fibrillation: Secondary | ICD-10-CM

## 2022-04-04 DIAGNOSIS — F0392 Unspecified dementia, unspecified severity, with psychotic disturbance: Secondary | ICD-10-CM | POA: Diagnosis not present

## 2022-04-04 LAB — CBC
HCT: 37.5 % (ref 36.0–46.0)
Hemoglobin: 12.3 g/dL (ref 12.0–15.0)
MCH: 29.2 pg (ref 26.0–34.0)
MCHC: 32.8 g/dL (ref 30.0–36.0)
MCV: 89.1 fL (ref 80.0–100.0)
Platelets: 296 10*3/uL (ref 150–400)
RBC: 4.21 MIL/uL (ref 3.87–5.11)
RDW: 12.9 % (ref 11.5–15.5)
WBC: 14 10*3/uL — ABNORMAL HIGH (ref 4.0–10.5)
nRBC: 0 % (ref 0.0–0.2)

## 2022-04-04 LAB — BASIC METABOLIC PANEL
Anion gap: 6 (ref 5–15)
BUN: 27 mg/dL — ABNORMAL HIGH (ref 8–23)
CO2: 24 mmol/L (ref 22–32)
Calcium: 9.1 mg/dL (ref 8.9–10.3)
Chloride: 115 mmol/L — ABNORMAL HIGH (ref 98–111)
Creatinine, Ser: 0.84 mg/dL (ref 0.44–1.00)
GFR, Estimated: >60 mL/min (ref >60)
Glucose, Bld: 130 mg/dL — ABNORMAL HIGH (ref 70–99)
Potassium: 3.8 mmol/L (ref 3.5–5.1)
Sodium: 145 mmol/L (ref 135–145)

## 2022-04-04 MED ORDER — SODIUM CHLORIDE 0.9 % IV BOLUS
500.0000 mL | Freq: Once | INTRAVENOUS | Status: AC
  Administered 2022-04-04: 500 mL via INTRAVENOUS

## 2022-04-04 NOTE — Progress Notes (Signed)
Speech Language Pathology Treatment: Dysphagia  Patient Details Name: [name] MRN: [id number] DOB: 08-Aug-1941 Today's Date: 04/04/2022 Time: 4818-5631 SLP Time Calculation (min) (ACUTE ONLY): 50 min  Assessment / Plan / Recommendation Clinical Impression  Pt seen for ongoing assessment of swallowing and trials to upgrade diet consistency as appropriate. Pt is currently tolerating the Pureed consistency diet but had been on a Regular diet at [facility], per Son, though missing most Dentition.   Pt appears to present w/ grossly adequate oropharyngeal phase swallow function w/ a modified diet consistency in setting of new RIGHT CVA AND w/ Baseline declined Cognitive status; advanced Dementia. Family has reported that pt's Dementia has been "worsening" in recent months prior to this admit. Cognitive decline can impact overall awareness/timing of swallow and safety during po tasks which increases risk for aspiration, choking. In addition, pt is Edentulous for Upper Dentition; has some lower Denitition. Lacking Dentition for effective mastication can increase risk for choking on poorly chewed foods.  Pt's risk for aspiration/choking can be reduced when following general aspiration precautions, supported w/ feeding assistance to monitor self-feeding d/t Cognitive decline, and when using a modified diet consistency d/t Edentulous status. During this evaluation, she required Mod verbal/visual/tactile cues for follow through during po tasks and self-feeding.             Pt consumed trials of purees, MINCED moist solids, and thin liquids via cup/straw w/ No overt clinical s/s of aspiration noted except 1 cough b/t po's -- unsure if related to swallowing or just saliva from recent bolus/foods. No decline in vocal quality w/ soft phonations/"yes"; no decline in respiratory status during/post trials. O2 sats remained upper 90s(98%) throughout. Oral phase was adequate for bolus management and oral clearing of  the boluses given w/ MIN increased bolus management of mashing/gumming/chewing w/ MINCED moist solid -- as expected w/out sufficient Dentition. Time given for gumming/mashing and oral clearing AND also alternated b/t MINCED foods and purees to aid clearing. Pt attempted self-feeding by HOLDING CUP when drinking w/ Min-Mod support and guidance d/t the Cognitive decline. She was able to use the RUE more this session d/t less noted edema. Holding cup improves safety of swallowing. Hand over hand guidance and visual cue were helpful to initiate tasks.              In setting of baseline Dementia/Cognitive decline, upper Edentulous status and missing lower Dentition, Large Hiatal Hernia, and her risk for aspiration and Reflux, recommend a dysphagia level 2(MINCED foods moistened) w/ added Purees for ease of oral phase/mastication; Tin liquids. General aspiration precautions; reduce Distractions during meals and engage pt during meals for self-feeding. Pills Crushed in Puree for safer swallowing as needed. Support w/ feeding at meals. MD/NSG updated.   ST services will monitor for toleration of diet. ST services recommends follow-up w/ Palliative Care for [place]; Dietician for nutrition support. Precautions posted in room.     HPI HPI: Pt is a 81 year old female with past medical history of advanced Dementia and Large chronic hiatal hernia from memory care facility who was brought into the emergency room on 6/3 for acute onset speaking difficulties with left-sided deficits.  Patient had been observed less than an hour before at her baseline and then in the cafeteria soon after, noted to be slumped over w/ left upper extremity weakness and difficulty talking and brought in on 6/3 to the ED.  CT scan of head noted questionable acute/subacute infarction in the right external capsule area as  well as an old infarct in the left posterior frontal region.  Patient felt to be candidate for thrombolysis and given TNK.  She was  then admitted to the ICU.  Patient went for MRI of brain on 6/5 which was a limited study due to patient agitation but right MCA territory infarcts noted.  CT angiogram of head and neck done on 6/5 noted CT appearance of right MCA territory infarcts as demonstrated by MRI.  CXR: "Large chronic hiatal hernia. No acute cardiopulmonary  abnormality.".      SLP Plan  Continue with current plan of care      Recommendations for follow up therapy are one component of a multi-disciplinary discharge planning process, led by the attending physician.  Recommendations may be updated based on patient status, additional functional criteria and insurance authorization.    Recommendations  Diet recommendations: Dysphagia 2 (fine chop);Thin liquid (w/ added purees) Liquids provided via: Cup;Straw (monitor and support -- pt to hold cup when drinking) Medication Administration: Crushed with puree Supervision: Staff to assist with self feeding;Full supervision/cueing for compensatory strategies Compensations: Minimize environmental distractions;Slow rate;Small sips/bites;Lingual sweep for clearance of pocketing;Follow solids with liquid Postural Changes and/or Swallow Maneuvers: Out of bed for meals;Seated upright 90 degrees;Upright 30-60 min after meal                General recommendations:  (Palliative Care for [place] support; Dietician f/u) Oral Care Recommendations: Oral care BID;Oral care before and after PO;Staff/trained caregiver to provide oral care Follow Up Recommendations: Follow physician's recommendations for discharge plan and follow up therapies Assistance recommended at discharge: Frequent or constant Supervision/Assistance (baseline Dementia) SLP Visit Diagnosis: Dysphagia, oral phase (R13.11) (Edentulous (upper); baseline Dementia) Plan: Continue with current plan of care             Orinda Kenner, [place], [place]; [place] [phone] (ascom) Tekila Caillouet  04/04/2022, 5:25 PM

## 2022-04-04 NOTE — Progress Notes (Signed)
Left Progress Note   Patient: [name] FXT:[id number] DOB: 1941-09-13 DOA: 03/31/2022     4 DOS: the patient was seen and examined on 04/04/2022   Brief hospital course: 81 year old female with past medical history of dementia from assisted living in memory care unit who was brought into the emergency room on 6/3 for acute onset speaking with left-sided deficits.  Patient had been observed less than an hour before at her baseline and then in the cafeteria soon after, noted to be slumped over and aphasic with left upper extremity weakness and brought in on 6/3.  CT scan of head noted questionable acute/subacute infarction in the right external capsule area as well as an old infarct in the left posterior frontal region.  Patient felt to be candidate for thrombolysis and given TNK.  She was then admitted to the ICU.  Aphasia appeared to have resolved with difficulty determining in regards to extremity weakness.  Patient went for MRI of brain on 6/5 which was a limited study due to patient agitation but right MCA territory infarcts noted.  CT angiogram of head and neck done on 6/5 noted CT appearance of right MCA territory infarcts as demonstrated by MRI and no evidence of large vessel occlusion.  Hospital course complicated by right upper extremity extravasation injury from TNK.  Family understanding that if patient should decline, they are opting for full comfort care.  Patient able to work with physical therapy, noting some weakness on left side  Assessment and Plan: * Ischemic stroke [facility]) Appreciate neurology and critical care help.  Status post TNK but not quite sure how much got to the brain with it infusing into the arm..  This has improved some of patient's symptoms although noted still some areas of infarction causing left upper extremity weakness.  Currently on aspirin.  Neurology will repeat a CT scan of the head tomorrow and then decide on Eliquis or not and the timing of this.  Paroxysmal  atrial fibrillation (HCC) Repeat CT scan tomorrow and then neurology will decide on full anticoagulation or not.  And the timing of this.  Extravasation injury of IV catheter site with other complication, initial encounter [facility]) While patient was receiving TNK, IV infiltrated in right hand and she has evidence of significant discoloration and swelling.  Moving her right arm well.  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  Hypothyroidism Continue Synthroid  Bipolar 1 disorder (HCC) Continue Abilify, BuSpar and Effexor.  Chronic constipation Resume Colace plus as needed MiraLAX  Overweight (BMI 25.0-29.9) Patient meets criteria BMI greater than 25        Subjective: Patient admitted with stroke and has left arm weakness and incoordination.  Answer some simple questions.  Physical Exam: Vitals:   04/04/22 0600 04/04/22 0700 04/04/22 0800 04/04/22 1000  BP:  128/73 119/74 (!) 178/125  Pulse: 76 (!) 104 68 81  Resp: (!) 23 20 (!) 23   Temp:  97.9 F (36.6 C) 97.9 F (36.6 C)   TempSrc:  Oral Oral   SpO2: 99% 97% 98% 95%  Weight:      Height:       Physical Exam HENT:     Head: Normocephalic.     Mouth/Throat:     Pharynx: No oropharyngeal exudate.  Eyes:     General: Lids are normal.     Conjunctiva/sclera: Conjunctivae normal.  Cardiovascular:     Rate and Rhythm: Normal rate. Rhythm irregularly irregular.  Heart sounds: Normal heart sounds, S1 normal and S2 normal.  Pulmonary:     Breath sounds: No decreased breath sounds, wheezing, rhonchi or rales.  Abdominal:     Palpations: Abdomen is soft.     Tenderness: There is no abdominal tenderness.  Musculoskeletal:     Right lower leg: No swelling.     Left lower leg: No swelling.  Skin:    General: Skin is warm.     Comments: Bruising entire right arm.  Neurological:     Mental Status: She is alert.     Comments: Answers some questions.   Weak with her lower extremities with straight leg raise.  Incoordination and weakness left arm secondary to stroke.    Data Reviewed:  Creatinine 0.84, hemoglobin 12.3, white blood cell count 14.0.  MRI of the brain shows right MCA territory infarcts affecting the basal ganglial right corona radiata and frontal operculum.  Probable associated petechial hemorrhage.  Family Communication: Updated son on the phone  Disposition: Status is: Inpatient Remains inpatient appropriate because: Being treated for stroke.  Awaiting insurance authorization for rehab.  Neurology wants to repeat CT scan tomorrow. Planned Discharge Destination: Rehab   Author: Loletha Grayer, MD 04/04/2022 2:01 PM  For on call review [email or web address].

## 2022-04-04 NOTE — Progress Notes (Signed)
Physical Therapy Treatment Patient Details Name: JACQULYN BARRESI MRN: [id number] DOB: Oct 22, 1941 Today's Date: 04/04/2022   History of Present Illness Patient is a 81 year old female with a history of dementia, resident of [place] presenting with acute onset of aphasia and left hemiparesis. 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 Probable associated petechial hemorrhage (Heidelberg Classification 1B), but no significant mass effect.    PT Comments    Patient more lethargic today, able to open eyes on command for most of session, but did seem to fatigue in sitting. Majority of session focused on promoting sitting balance and pt participation, varying CGA-modA due to posterior lean. MaxA for bed mobility, pt able to initiate moving LE's off bed as well as to initiate trunk elevation. Lateral scoot to recliner with totalAx2, pt cued for anterior lean. The patient would benefit from further skilled PT intervention to continue to progress towards goals. Recommendation remains appropriate.       Recommendations for follow up therapy are one component of a multi-disciplinary discharge planning process, led by the attending physician.  Recommendations may be updated based on patient status, additional functional criteria and insurance authorization.  Follow Up Recommendations  Skilled nursing-short term rehab (<3 hours/day)     Assistance Recommended at Discharge Frequent or constant Supervision/Assistance  Patient can return home with the following Two people to help with walking and/or transfers;Two people to help with bathing/dressing/bathroom;Help with stairs or ramp for entrance;Assist for transportation;Direct supervision/assist for medications management;Assistance with feeding;Assistance with cooking/housework   Equipment Recommendations  None recommended by PT    Recommendations for Other Services        Precautions / Restrictions Precautions Precautions: Fall Restrictions Weight Bearing Restrictions: No     Mobility  Bed Mobility Overal bed mobility: Needs Assistance Bed Mobility: Supine to Sit, Sit to Supine     Supine to sit: Max assist          Transfers Overall transfer level: Needs assistance   Transfers: Bed to chair/wheelchair/BSC, Sit to/from Stand Sit to Stand: Total assist, +2 physical assistance          Lateral/Scoot Transfers: Total assist, +2 physical assistance General transfer comment: bilateral knee block, pt able to attempt to straighten up in standing today. lateral scoot to chair    Ambulation/Gait                   Stairs             Wheelchair Mobility    Modified Rankin (Stroke Patients Only)       Balance Overall balance assessment: Needs assistance Sitting-balance support: Bilateral upper extremity supported Sitting balance-Leahy Scale: Poor Sitting balance - Comments: varying, able to maintain seated balance momentarily intermittently with CGA. predominantly min-modA Postural control: Posterior lean Standing balance support: Bilateral upper extremity supported Standing balance-Leahy Scale: Zero                              Cognition Arousal/Alertness: Lethargic Behavior During Therapy: Flat affect Overall Cognitive Status: History of cognitive impairments - at baseline                                 General Comments: states name, did say bye        Exercises      General  Comments        Pertinent Vitals/Pain Pain Assessment Pain Assessment: Faces Faces Pain Scale: No hurt    Home Living                          Prior Function            PT Goals (current goals can now be found in the care plan section) Progress towards PT goals: Progressing toward goals    Frequency    7X/week      PT Plan Current plan remains appropriate    Co-evaluation               AM-PAC PT "6 Clicks" Mobility   Outcome Measure  Help needed turning from your back to your side while in a flat bed without using bedrails?: A Lot Help needed moving from lying on your back to sitting on the side of a flat bed without using bedrails?: Total Help needed moving to and from a bed to a chair (including a wheelchair)?: Total Help needed standing up from a chair using your arms (e.g., wheelchair or bedside chair)?: Total Help needed to walk in hospital room?: Total Help needed climbing 3-5 steps with a railing? : Total 6 Click Score: 7    End of Session   Activity Tolerance: Patient limited by lethargy Patient left: in chair;with call bell/phone within reach Nurse Communication: Mobility status;Need for lift equipment PT Visit Diagnosis: Unsteadiness on feet (R26.81);Muscle weakness (generalized) (M62.81);Other abnormalities of gait and mobility (R26.89)     Time: 7616-0737 PT Time Calculation (min) (ACUTE ONLY): 27 min  Charges:  $Therapeutic Activity: 8-22 mins $Neuromuscular Re-education: 8-22 mins                     Lieutenant Diego PT, DPT 12:34 PM,04/04/22

## 2022-04-04 NOTE — Assessment & Plan Note (Signed)
Repeat CT scan tomorrow and then neurology will decide on full anticoagulation or not.  And the timing of this.

## 2022-04-04 NOTE — TOC Progression Note (Signed)
Transition of Care [facility]) - Progression Note    Patient Details  Name: [name] MRN: [id number] Date of Birth: 06/21/1941  Transition of Care [facility]) CM/SW Contact  Shelbie Hutching, RN Phone Number: 04/04/2022, 8:54 AM  Clinical Narrative:    Pasrr is back [phone] H.  Insurance authorization is still pending.     Expected Discharge Plan: [place] Barriers to Discharge: Continued Medical Work up  Expected Discharge Plan and Services Expected Discharge Plan: [place]   Discharge Planning Services: CM Consult Post Acute Care Choice: [place] Living arrangements for the past 2 months: Assisted Living Facility                 DME Arranged: N/A DME Agency: NA       HH Arranged: NA HH Agency: NA         Social Determinants of Health (SDOH) Interventions    Readmission Risk Interventions     View : No data to display.

## 2022-04-04 NOTE — Progress Notes (Signed)
   04/04/22 1700  Clinical Encounter Type  Visited With Patient  Visit Type Initial  Referral From Chaplain  Consult/Referral To [place] responded to request to visit patient. Patient was non-verbal at the time of visit. Chaplain provided compassionate presence and prayer.

## 2022-04-04 NOTE — Plan of Care (Signed)

## 2022-04-04 NOTE — Progress Notes (Signed)
Patient has had only 50 mL urinary output this shift. Bladder scan at 0130 revealed only 36 mL. Dr. Damita Dunnings, MD, notified and a 500 mL bolus was administered over 2 hours. Bladder re-scan at 0530 shows only 56 mL. Patient does not appear to be uncomfortable. Will continue to monitor and assess the patient.  Cameron Ali, RN

## 2022-04-05 ENCOUNTER — Inpatient Hospital Stay: Payer: Medicare Other

## 2022-04-05 MED ORDER — ENSURE ENLIVE PO LIQD: 237.0000 mL | Freq: Three times a day (TID) | ORAL | 0 refills | Status: AC

## 2022-04-05 MED ORDER — APIXABAN 5 MG PO TABS
5.0000 mg | ORAL_TABLET | Freq: Two times a day (BID) | ORAL | Status: DC
  Administered 2022-04-05: 5 mg via ORAL
  Filled 2022-04-05: qty 1

## 2022-04-05 MED ORDER — ATORVASTATIN CALCIUM 20 MG PO TABS: 20.0000 mg | ORAL_TABLET | Freq: Every day | ORAL | 0 refills | Status: AC

## 2022-04-05 MED ORDER — ADULT MULTIVITAMIN W/MINERALS CH
1.0000 | ORAL_TABLET | Freq: Every day | ORAL | 0 refills | Status: AC
Start: 2022-04-06 — End: ?

## 2022-04-05 MED ORDER — APIXABAN 5 MG PO TABS: 5.0000 mg | ORAL_TABLET | Freq: Two times a day (BID) | ORAL | 0 refills | Status: AC

## 2022-04-05 NOTE — Discharge Summary (Signed)
Physician Discharge Summary   Patient: [name] MRN: [id number] DOB: Aug 27, 1941  Admit date:     03/31/2022  Discharge date: 04/05/22  Discharge Physician: Loletha Grayer   PCP: Pcp, No   Recommendations at discharge:   Follow-up with team at rehab 1 day Follow-up with neurology as outpatient  Discharge Diagnoses: Principal Problem:   Ischemic stroke 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  Patient had been observed less than an hour before at her baseline and then in the cafeteria soon after, noted to be slumped over and aphasic with left upper extremity weakness and brought in on 6/3.  CT scan of head noted questionable acute/subacute infarction in the right external capsule area as well as an old infarct in the left posterior frontal region.  Patient felt to be candidate for thrombolysis and given TNK.  She was then admitted to the ICU.  Aphasia appeared to have resolved with difficulty determining in regards to extremity weakness.  Patient went for MRI of brain on 6/5 which was a limited study due to patient agitation but right MCA territory infarcts noted.  CT angiogram of head and neck done on 6/5 noted CT appearance of right MCA territory infarcts as demonstrated by MRI and no evidence of large vessel occlusion.  Hospital course complicated by right upper extremity extravasation injury from TNK.  Family understanding that if patient should decline, they are opting for full comfort care.   Patient able to work with physical therapy, noting some weakness on left side.  The patient has atrial fibrillation.  Repeat CT scan did not show any bleeding and did show stable evolving acute or subacute infarct centered in the right external capsule and basal ganglia.  Neurology cleared to go on Eliquis and discontinue aspirin.  Patient will be transferred out to rehab.  Assessment and Plan: * Ischemic stroke [facility]) Appreciate neurology and critical care help.  Status post TNK but not quite sure how much got to the brain with it infusing into the arm.  This has improved some of patient's symptoms although noted still left upper extremity weakness.  Repeat CT scan did not show any hemorrhage but showed stable evolving acute to subacute infarct centered in the right external capsule and basal ganglia.  Patient still has some left-sided weakness.  Neurology cleared to go on Eliquis.  Risks of bleeding explained to patient and son.  Paroxysmal atrial fibrillation (HCC) Repeat CT scan tomorrow did not show any bleeding and will be placed on Eliquis.  Extravasation injury of IV catheter site with other complication, initial encounter [facility]) While patient was receiving TNK, IV infiltrated in right hand and she has evidence of significant discoloration and swelling.  Moving her right arm well.  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  Prior to coming into the hospital was at [facility] memory unit.  Hypothyroidism Continue Synthroid  Bipolar 1 disorder (HCC) Continue Abilify, BuSpar and Effexor.  Chronic constipation Continue as needed medications.  Overweight (BMI 25.0-29.9)  Patient meets criteria BMI greater than 25  Patient is a DNR         Consultants: Neurology Procedures performed: TNK infusion Disposition: Rehabilitation facility Diet recommendation:  Dysphagia 2 diet with thin liquids  low-salt. DISCHARGE MEDICATION: Allergies as of 04/05/2022       Reactions   Codeine Phosphate [codeine]    bulk   Shellfish Allergy         Medication List     STOP taking these medications    Biotin 1000 MCG tablet       TAKE these medications    apixaban 5 MG Tabs tablet Commonly known as: ELIQUIS Take 1 tablet (5 mg total) by mouth 2 (two) times daily.   ARIPiprazole 2 MG tablet Commonly known as: ABILIFY Take 2 mg by mouth every evening.   atorvastatin 20 MG tablet Commonly known as: LIPITOR Take 1 tablet (20 mg total) by mouth daily. Start taking on: April 06, 2022   busPIRone 15 MG tablet Commonly known as: BUSPAR Take 15 mg by mouth 2 (two) times daily.   docusate sodium 100 MG capsule Commonly known as: COLACE Take 100 mg by mouth 2 (two) times daily.   feeding supplement Liqd Take 237 mLs by mouth 3 (three) times daily between meals.   levothyroxine 88 MCG tablet Commonly known as: SYNTHROID Take 88 mcg by mouth daily before breakfast.   multivitamin with minerals Tabs tablet Take 1 tablet by mouth daily. Start taking on: April 06, 2022   nystatin cream Commonly known as: MYCOSTATIN Apply 1 application. topically 2 (two) times daily. What changed: Another medication with the same name was removed. Continue taking this medication, and follow the directions you see here.   Psyllium 400 MG Caps Take 400 mg by mouth at bedtime.   venlafaxine XR 150 MG 24 hr capsule Commonly known as: EFFEXOR-XR Take 150 mg by mouth in the morning.        Contact information for after-discharge care     [facility] .   Service: Skilled Nursing Contact information: [place] [phone]                    Discharge Exam: Filed Weights   04/03/22 0430 04/04/22 0200 04/05/22 0500  Weight: 64.7 kg  63.6 kg 67.1 kg   Physical Exam HENT:     Head: Normocephalic.     Mouth/Throat:     Pharynx: No oropharyngeal exudate.  Eyes:     General: Lids are normal.     Conjunctiva/sclera: Conjunctivae normal.  Cardiovascular:     Rate and Rhythm: Normal rate. Rhythm irregularly irregular.     Heart sounds: Normal heart sounds, S1 normal and S2 normal.  Pulmonary:     Breath sounds: No decreased breath sounds, wheezing, rhonchi or rales.  Abdominal:     Palpations: Abdomen is soft.     Tenderness: There is no abdominal tenderness.  Musculoskeletal:     Right lower leg: No swelling.     Left lower leg: No swelling.  Skin:    General: Skin is warm.     Comments: Bruising entire right arm.  Neurological:     Mental Status: She is alert.     Comments: Answers some questions.  Weak with her lower extremities with straight leg raise.  Incoordination and weakness left arm secondary to  stroke.      Condition at discharge: fair  The results of significant diagnostics from this hospitalization (including imaging, microbiology, ancillary and laboratory) are listed below for reference.   Imaging Studies: CT HEAD WO CONTRAST (5MM)  Result Date: 04/05/2022 CLINICAL DATA:  Acute right MCA territory infarcts by MRI 04/02/2022 EXAM: CT HEAD WITHOUT CONTRAST TECHNIQUE: Contiguous axial images were obtained from the base of the skull through the vertex without intravenous contrast. RADIATION DOSE REDUCTION: This exam was performed according to the departmental dose-optimization program which includes automated exposure control, adjustment of the mA and/or kV according to patient size and/or use of iterative reconstruction technique. COMPARISON:  04/02/2022 FINDINGS: Brain: Stable hypodensity centered in the right external capsule and basal ganglia compatible with a known evolving right MCA territory acute or subacute infarct. No acute interval intracranial hemorrhage or significant mass effect. Stable  atrophy pattern and chronic white matter microvascular ischemic changes. Stable left frontal white matter encephalomalacia about the left lateral ventricle from a remote infarct. No hydrocephalus, herniation, or significant mass effect. Cisterns remain patent. No significant cerebellar abnormality. Vascular: No hyperdense vessel or unexpected calcification. Skull: Normal. Negative for fracture or focal lesion. Sinuses/Orbits: No acute finding. Other: None. IMPRESSION: Stable evolving acute or subacute infarct centered in the right external capsule and basal ganglia. No acute intracranial hemorrhage. Stable atrophy, chronic white matter microvascular changes and areas of remote infarct. Electronically Signed   By: Jerilynn Mages.  Shick M.D.   On: 04/05/2022 08:35   CT ANGIO HEAD NECK W WO CM  Result Date: 04/02/2022 CLINICAL DATA:  81 year old female with right MCA infarcts on brain MRI this morning. Neurologic deficit. EXAM: CT ANGIOGRAPHY HEAD AND NECK TECHNIQUE: Multidetector CT imaging of the head and neck was performed using the standard protocol during bolus administration of intravenous contrast. Multiplanar CT image reconstructions and MIPs were obtained to evaluate the vascular anatomy. Carotid stenosis measurements (when applicable) are obtained utilizing NASCET criteria, using the distal internal carotid diameter as the denominator. RADIATION DOSE REDUCTION: This exam was performed according to the departmental dose-optimization program which includes automated exposure control, adjustment of the mA and/or kV according to patient size and/or use of iterative reconstruction technique. CONTRAST:  67m OMNIPAQUE IOHEXOL 350 MG/ML SOLN COMPARISON:  Brain MRI today.  Head CT 04/01/2022. FINDINGS: CT HEAD Brain: Cytotoxic edema in the right basal ganglia is stable since the CT yesterday. No malignant hemorrhagic transformation. No significant intracranial mass effect. Stable noncontrast CT appearance of the brain  otherwise, chronic encephalomalacia in the left hemisphere. Normal basilar cisterns. Calvarium and skull base: No acute osseous abnormality identified. Paranasal sinuses: Visualized paranasal sinuses and mastoids are stable and well aerated. Orbits: No acute orbit or scalp soft tissue finding. CTA NECK Skeleton: Absent maxillary dentition. Age-appropriate cervical spine degeneration. Age indeterminate T3 upper thoracic compression fracture is mild. Underlying osteopenia. Upper chest: Mild respiratory motion, upper lungs appear negative. There is a polypoid 8 mm filling defect in the trachea on series 6, image 134. Negative visible superior mediastinum otherwise. Other neck: Negative. Aortic arch: Mildly tortuous aortic arch. Three vessel arch configuration. Mild for age arch atherosclerosis. Right carotid system: Tortuous brachiocephalic artery, right CCA origin and proximal right CCA with a kinked appearance but no plaque or stenosis. Negative right carotid bifurcation. Tortuous right ICA just below the skull base without stenosis. Left carotid system: Similar tortuosity with no significant plaque or stenosis. Vertebral arteries: Tortuous proximal right subclavian artery with minimal plaque and no stenosis. Normal right vertebral  artery origin. Tortuous right V1 segment. Patent right vertebral to the skull base with no plaque or stenosis. Tortuous proximal left subclavian artery with mild plaque and no significant stenosis. Tortuous left vertebral artery origin and V1 segment. The left vertebral is mildly non dominant with V2 segment tortuosity. No plaque or stenosis to the skull base. CTA HEAD Posterior circulation: Dominant right V4 segment. No distal vertebral or vertebrobasilar junction plaque or stenosis. Patent PICA origins. Patent basilar artery with tortuosity but no significant stenosis. Patent SCA and right PCA origins. Fetal type left PCA origin. Right posterior communicating artery is also present left  PCA branches are within normal limits. Right PCA branches are normal aside from mild irregularity and stenosis of the inferior P3 division on series 16, image 22. Anterior circulation: Both ICA siphons are patent with mild tortuosity but no significant plaque or stenosis. Normal bilateral posterior communicating artery origins. Patent carotid termini, MCA and ACA origins. Normal anterior communicating artery. Left MCA M1 segment and bifurcation are patent without stenosis. There is a subtle infundibulum versus tiny 1 mm aneurysm at the left MCA bifurcation on series 13, image 160. Otherwise the left MCA branches appear within normal limits. ACA branches are within normal limits. Right MCA M1 segment and bifurcation are patent without stenosis. Right MCA branches appear within normal limits. Venous sinuses: Appear patent with dominant right transverse and sigmoid venous sinuses suspected. Anatomic variants: Mildly dominant right vertebral artery. Fetal type left PCA origin. Dominant right transverse and sigmoid venous sinuses. Review of the MIP images confirms the above findings IMPRESSION: 1. Negative for large vessel occlusion. Generalized arterial tortuosity but mild for age atherosclerosis in the head or neck with no significant stenosis 2. 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 3. Positive also for a nonspecific 8 mm polypoid filling defect in the thoracic trachea above the carina (series 11, image 189). This might be retained or aspirated secretions rather than a small airway mass, but bronchoscopy or clearing of the lesion on a Chest CT would be necessary to confirm. 4. Expected CT appearance of Right MCA territory infarcts demonstrated by MRI this morning. No malignant hemorrhagic transformation or mass effect. 5. Mild upper thoracic compression fracture at T3, age indeterminate but probably chronic. Electronically Signed   By: Genevie Ann M.D.   On: 04/02/2022 10:44   ECHOCARDIOGRAM COMPLETE  Result Date: 04/02/2022    ECHOCARDIOGRAM REPORT   Patient Name:   [name] Date of Exam: 04/01/2022 Medical Rec #:  [id number]        Height:       62.0 in Accession #:    [id number]       Weight:       154.8 lb Date of Birth:  [birth year]         BSA:          1.714 m Patient Age:    5 years         BP:           166/81 mmHg Patient Gender: F                HR:           78 bpm. Exam Location:  [facility] Procedure: 2D Echo, Cardiac Doppler and Color Doppler Indications:     I63.9 Stroke  History:         Patient has no prior history of Echocardiogram  examinations.  Sonographer:     Rosalia Hammers Referring Phys:  [id number] Vigo L RUST-CHESTER Diagnosing Phys: Neoma Laming  Sonographer Comments: Suboptimal parasternal window and suboptimal subcostal window. Image acquisition challenging due to uncooperative patient, Image acquisition challenging due to respiratory motion and Image acquisition challenging due to patient body  habitus. IMPRESSIONS  1. Left ventricular ejection fraction, by estimation, is 60 to 65%. The left ventricle has normal function. The left ventricle has no regional wall motion abnormalities. There is mild left ventricular hypertrophy. Left ventricular diastolic parameters are consistent with Grade I diastolic dysfunction (impaired relaxation).  2. Right ventricular systolic function is normal. The right ventricular size is normal.  3. The mitral valve is normal in structure. Mild mitral valve regurgitation. No evidence of mitral stenosis.  4. The aortic valve is normal in structure. Aortic valve regurgitation is moderate. Aortic valve sclerosis/calcification is present, without any evidence of aortic stenosis.  5. The inferior vena cava is normal in size with greater than 50% respiratory variability, suggesting right atrial pressure of 3 mmHg. FINDINGS  Left Ventricle: Left ventricular ejection fraction, by estimation, is 60 to 65%.  The left ventricle has normal function. The left ventricle has no regional wall motion abnormalities. The left ventricular internal cavity size was normal in size. There is  mild left ventricular hypertrophy. Left ventricular diastolic parameters are consistent with Grade I diastolic dysfunction (impaired relaxation). Right Ventricle: The right ventricular size is normal. No increase in right ventricular wall thickness. Right ventricular systolic function is normal. Left Atrium: Left atrial size was normal in size. Right Atrium: Right atrial size was normal in size. Pericardium: There is no evidence of pericardial effusion. Mitral Valve: The mitral valve is normal in structure. Mild mitral valve regurgitation. No evidence of mitral valve stenosis. Tricuspid Valve: The tricuspid valve is normal in structure. Tricuspid valve regurgitation is not demonstrated. No evidence of tricuspid stenosis. Aortic Valve: The aortic valve is normal in structure. Aortic valve regurgitation is moderate. Aortic regurgitation PHT measures 460 msec. Aortic valve sclerosis/calcification is present, without any evidence of aortic stenosis. Aortic valve mean gradient measures 12.0 mmHg. Aortic valve peak gradient measures 20.6 mmHg. Aortic valve area, by VTI measures 1.68 cm. Pulmonic Valve: The pulmonic valve was normal in structure. Pulmonic valve regurgitation is not visualized. No evidence of pulmonic stenosis. Aorta: The aortic root is normal in size and structure. Venous: The inferior vena cava is normal in size with greater than 50% respiratory variability, suggesting right atrial pressure of 3 mmHg. IAS/Shunts: No atrial level shunt detected by color flow Doppler.  LEFT VENTRICLE PLAX 2D LVIDd:         2.29 cm   Diastology LVIDs:         1.60 cm   LV e' medial:    8.92 cm/s LV PW:         1.11 cm   LV E/e' medial:  11.4 LV IVS:        1.22 cm   LV e' lateral:   11.20 cm/s LVOT diam:     1.80 cm   LV E/e' lateral: 9.1 LV SV:          70 LV SV Index:   41 LVOT Area:     2.54 cm  RIGHT VENTRICLE RV Basal diam:  3.26 cm RV S prime:     14.50 cm/s LEFT ATRIUM             Index  RIGHT ATRIUM           Index LA diam:        2.80 cm 1.63 cm/m   RA Area:     13.50 cm LA Vol (A2C):   53.5 ml 31.21 ml/m  RA Volume:   28.30 ml  16.51 ml/m LA Vol (A4C):   49.9 ml 29.11 ml/m LA Biplane Vol: 52.9 ml 30.86 ml/m  AORTIC VALVE                     PULMONIC VALVE AV Area (Vmax):    1.72 cm      PV Vmax:       1.36 m/s AV Area (Vmean):   1.56 cm      PV Vmean:      88.900 cm/s AV Area (VTI):     1.68 cm      PV VTI:        0.228 m AV Vmax:           227.00 cm/s   PV Peak grad:  7.4 mmHg AV Vmean:          165.000 cm/s  PV Mean grad:  4.0 mmHg AV VTI:            0.419 m AV Peak Grad:      20.6 mmHg AV Mean Grad:      12.0 mmHg LVOT Vmax:         153.00 cm/s LVOT Vmean:        101.000 cm/s LVOT VTI:          0.277 m LVOT/AV VTI ratio: 0.66 AI PHT:            460 msec  AORTA Ao Root diam: 2.80 cm MITRAL VALVE MV Area (PHT): 5.50 cm     SHUNTS MV Decel Time: 138 msec     Systemic VTI:  0.28 m MV E velocity: 102.00 cm/s  Systemic Diam: 1.80 cm MV A velocity: 62.60 cm/s MV E/A ratio:  1.63 [employer] signed by Neoma Laming Signature Date/Time: 04/02/2022/8:41:02 AM    Final    MR BRAIN WO CONTRAST  Result Date: 04/02/2022 CLINICAL DATA:  81 year old female with possible acute or subacute infarct right external capsule on plain head CT. EXAM: MRI HEAD WITHOUT CONTRAST TECHNIQUE: Multiplanar, multiecho pulse sequences of the brain and surrounding structures were obtained without intravenous contrast. COMPARISON:  Head CT yesterday. Brain MRI [facility] 06/17/2008. FINDINGS: The examination had to be discontinued prior to completion due to patient agitation. No axial T2 or T1 weighted imaging was obtained, and the remainder of the exam is intermittently degraded by motion despite repeated imaging attempts. Brain:  Biconvex roughly 3 cm area of restricted diffusion throughout the right caudate and tracking into the lentiform (series 7, image 20) with additional small area of cortical restricted diffusion right frontal operculum. Right insula appears to be spared. But also there is of the 12 mm area of restricted diffusion in the right centrum semiovale (series 5, image 31). Mild mass effect on the right lateral ventricle appears related to the cytotoxic edema. Associated T2 and FLAIR hyperintensity. Evidence of petechial hemorrhage (Heidelberg Classification 1B, series 10, image 32). Contralateral chronic encephalomalacia in the left periventricular white matter appears stable since the 2009 MRI. Associated chronic ex vacuo enlargement of the left lateral ventricle. Patchy additional bilateral cerebral white matter FLAIR hyperintensity may have increased. No midline shift, mass effect, ventriculomegaly. Cervicomedullary junction  and pituitary are within normal limits. Vascular: Grossly preserved on coronal T2 imaging. Skull and upper cervical spine: Visualized bone marrow signal is within normal limits. Sinuses/Orbits: Negative. Other: Grossly normal visible internal auditory structures. IMPRESSION: 1. Truncated and motion degraded exam due to patient agitation. 2. 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 Probable associated petechial hemorrhage (Heidelberg Classification 1B), but no significant mass effect. 3. No other acute finding evident. 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 Electronically Signed   By: Genevie Ann M.D.   On: 04/02/2022 06:11   DG Abd Portable 1V  Result Date: 04/02/2022 CLINICAL DATA:  81 year old female undergoing clearance for MRI. Neurologic deficit, subacute infarct suspected on plain head CT. EXAM: PORTABLE ABDOMEN - 1 VIEW COMPARISON:  Abdominal radiographs 04/05/2015 and earlier. CT Abdomen and Pelvis 02/08/2014. FINDINGS: Supine  portable views at 0356 hours. Large chronic hiatal hernia redemonstrated. Chronic right hip arthroplasty. EKG leads and wires overlie the chest and abdomen. No other retained radiopaque foreign body identified. Non obstructed bowel gas pattern. Degenerative changes in the spine. No acute osseous abnormality identified. Abdominal and pelvic visceral contours are within normal limits. IMPRESSION: 1. Chronic right hip arthroplasty with no other retained metallic foreign body in the abdomen or pelvis. No contraindication to MRI. 2.  Non obstructed bowel gas pattern.  Large chronic hiatal hernia. Electronically Signed   By: Genevie Ann M.D.   On: 04/02/2022 04:32   DG Chest Port 1 View  Result Date: 04/02/2022 CLINICAL DATA:  81 year old female undergoing clearance for MRI. Neurologic deficit, subacute infarct suspected on plain head CT. EXAM: PORTABLE CHEST 1 VIEW COMPARISON:  Chest radiographs 11/11/2015 and earlier. FINDINGS: Portable AP supine view at 0357 hours. Large chronic gastric hiatal hernia. Stable cardiac and mediastinal contours since 2017. Normal lung volumes. Allowing for portable technique the lungs are clear. No pneumothorax or pleural effusion evident on this supine view. Visualized tracheal air column is within normal limits. Negative visible bowel gas. No acute osseous abnormality identified. EKG leads and wires overlie the chest. No radiopaque foreign body identified. IMPRESSION: 1.  No retained radiopaque foreign body identified in the chest. 2. Large chronic hiatal hernia. No acute cardiopulmonary abnormality. Electronically Signed   By: Genevie Ann M.D.   On: 04/02/2022 04:31   CT HEAD WO CONTRAST (5MM)  Result Date: 04/01/2022 CLINICAL DATA:  Stroke follow-up EXAM: CT HEAD WITHOUT CONTRAST TECHNIQUE: Contiguous axial images were obtained from the base of the skull through the vertex without intravenous contrast. RADIATION DOSE REDUCTION: This exam was performed according to the departmental  dose-optimization program which includes automated exposure control, adjustment of the mA and/or kV according to patient size and/or use of iterative reconstruction technique. COMPARISON:  03/31/2022 FINDINGS: Brain: No additional area of hypodensity. Redemonstrated left frontal encephalomalacia from remote infarct. Redemonstrated hypodensity in the right external capsule, which could be acute or subacute and appears overall unchanged in extent compared to the prior exam; possible minimal hyperdensity within this area (series 2, image 18 could represent petechial hemorrhage. No mass, mass effect, or midline shift. No hydrocephalus or extra-axial collection. Periventricular white matter changes, likely the sequela of chronic small vessel ischemic disease. Vascular: No hyperdense vessel. Skull: No acute osseous abnormality. Sinuses/Orbits: No acute finding. Other: The mastoids are well aerated. IMPRESSION: Redemonstrated area of hypodensity in the right external capsule, possibly an acute or subacute infarct, with minimal new hyperdensity in this area, which could represent petechial hemorrhage. An MRI is  recommended for further evaluation. These results will be called to the ordering clinician or representative by the Radiologist Assistant, and communication documented in the PACS or [employer]. Electronically Signed   By: Merilyn Baba M.D.   On: 04/01/2022 19:21   CT HEAD WO CONTRAST (5MM)  Result Date: 03/31/2022 CLINICAL DATA:  Neuro deficit, acute, stroke suspected. Left-sided weakness. Speech disturbance. EXAM: CT HEAD WITHOUT CONTRAST TECHNIQUE: Contiguous axial images were obtained from the base of the skull through the vertex without intravenous contrast. RADIATION DOSE REDUCTION: This exam was performed according to the departmental dose-optimization program which includes automated exposure control, adjustment of the mA and/or kV according to patient size and/or use of iterative reconstruction  technique. COMPARISON:  05/22/2018 FINDINGS: Brain: Generalized atrophy. 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 Chronic small-vessel ischemic changes of the cerebral hemispheric white matter, progressive since 2019. Low-density in the right external capsule region could possibly be acute or subacute infarction. No mass, hemorrhage, obstructive hydrocephalus or extra-axial collection. Vascular: There is atherosclerotic calcification of the major vessels at the base of the brain. Skull: Negative Sinuses/Orbits: Clear/normal Other: None IMPRESSION: Question acute/subacute infarction in the right external capsule region. 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 Extensive chronic small-vessel ischemic changes elsewhere throughout the brain, progressive since 2019. These results were communicated to Dr. Cheral Marker at 6:26 pm on 03/31/2022 by text page via the [facility] messaging system. Electronically Signed   By: Nelson Chimes M.D.   On: 03/31/2022 18:32    Microbiology: Results for orders placed or performed during the hospital encounter of 03/31/22  MRSA Next Gen by PCR, Nasal     Status: None   Collection Time: 03/31/22 11:42 PM   Specimen: Nasal Mucosa; Nasal Swab  Result Value Ref Range Status   MRSA by PCR Next Gen NOT DETECTED NOT DETECTED Final    Comment: (NOTE) The GeneXpert MRSA Assay (FDA approved for NASAL specimens only), is one component of a comprehensive MRSA colonization surveillance program. It is not intended to diagnose MRSA infection nor to guide or monitor treatment for MRSA infections. Test performance is not FDA approved in patients less than 4 years old. Performed at [facility], [address] [place], [place] [address]     Labs: CBC: Recent Labs  Lab 03/31/22 1852 04/01/22 0458 04/02/22 0842 04/03/22 1027 04/04/22 0502  WBC 7.6 9.7 11.1* 12.6* 14.0*  NEUTROABS 5.5  --   --    --   --   HGB 11.9* 11.3* 11.8* 12.5 12.3  HCT 37.7 34.8* 36.0 37.8 37.5  MCV 92.6 90.2 89.1 88.5 89.1  PLT 169 232 242 279 086   Basic Metabolic Panel: Recent Labs  Lab 03/31/22 1852 04/01/22 0458 04/02/22 0842 04/03/22 0422 04/04/22 0502  NA 140 137 137 143 145  K 4.3 3.7 3.6 3.6 3.8  CL 105 107 106 110 115*  CO2 '26 24 22 24 24  '$ GLUCOSE 107* 98 100* 119* 130*  BUN '13 11 14 18 '$ 27*  CREATININE 0.81 0.65 0.79 0.79 0.84  CALCIUM 8.8* 8.8* 8.9 9.4 9.1  MG  --  2.0 2.0 2.3  --   PHOS  --  2.4* 3.4  --   --    Liver Function Tests: Recent Labs  Lab 03/31/22 1852  AST 26  ALT 12  ALKPHOS 104  BILITOT 0.6  PROT 7.5  ALBUMIN 3.8   CBG: Recent Labs  Lab 04/02/22  1540 04/02/22 2000 04/03/22 0055 04/03/22 0346 04/03/22 1118  GLUCAP 123* 115* 118* 114* 114*    Discharge time spent: greater than 30 minutes.  Signed: Loletha Grayer, MD [facility] 04/05/2022

## 2022-04-05 NOTE — TOC Transition Note (Signed)
Transition of Care [facility]) - CM/SW Discharge Note   Patient Details  Name: [name] MRN: [id number] Date of Birth: October 17, 1941  [facility] of Care [facility]) CM/SW Contact:  Alberteen Sam, LCSW Phone Number: 04/05/2022, 10:38 AM   Clinical Narrative:     Patient will DC to: [facility] Anticipated DC date: 04/05/22 Family notified:son [name] Transport by: Johnanna Schneiders  Per MD patient ready for DC to [facility] . RN, patient, patient's family, and facility notified of DC. Discharge Summary sent to facility. RN given number for report [phone]  . DC packet on chart. Ambulance transport requested for patient.  CSW signing off.   Pricilla Riffle, LCSW    Final next level of care: Skilled Nursing Facility Barriers to Discharge: No Barriers Identified   Patient Goals and CMS Choice Patient states their goals for this hospitalization and ongoing recovery are:: to go home CMS Medicare.gov Compare Post Acute Care list provided to:: Patient Choice offered to / list presented to : Patient  Discharge Placement              Patient chooses bed at: [facility] Patient to be transferred to facility by: [facility]   Patient and family notified of of transfer: 04/05/22  Discharge Plan and Services   Discharge Planning Services: CM Consult Post Acute Care Choice: [place]          DME Arranged: N/A DME Agency: NA       HH Arranged: NA HH Agency: NA        Social Determinants of Health (SDOH) Interventions     Readmission Risk Interventions     No data to display

## 2022-04-05 NOTE — Plan of Care (Signed)
CT head at day 5 from acute infarct shows no CT e/o hemorrhage. OK to start anticoagulation for a fib with eliquis today. Please d/c aspirin. I will arrange outpatient f/u with neurology.  Su Monks, MD [facility] [phone]  If 7pm- 7am, please page neurology on call as listed in [place].

## 2022-04-05 NOTE — Progress Notes (Signed)
EMS to beside for discharge. PIV removed. Report called to [facility].

## 2022-04-05 NOTE — Progress Notes (Signed)
Physical Therapy Treatment Patient Details Name: [name] MRN: [id number] DOB: 06-Jun-1941 Today's Date: 04/05/2022   History of Present Illness Patient is a 81 year old female with a history of dementia, resident of [place] presenting with acute onset of aphasia and left hemiparesis. 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 Probable associated petechial hemorrhage (Heidelberg Classification 1B), but no significant mass effect.    PT Comments    Patient remains lethargic overall but has periods of increased alertness compared to previous sessions. Patient participated with LE bed level exercises for strengthening. +2 person assistance required for bed mobility and for standing x 2 bouts. Limited standing tolerance and generalized weakness throughout limits further activity. The patient is not at her baseline level of functional mobility. Recommend to continue PT to maximize independence and facilitate return to prior level of function.    Recommendations for follow up therapy are one component of a multi-disciplinary discharge planning process, led by the attending physician.  Recommendations may be updated based on patient status, additional functional criteria and insurance authorization.  Follow Up Recommendations  Skilled nursing-short term rehab (<3 hours/day)     Assistance Recommended at Discharge Frequent or constant Supervision/Assistance  Patient can return home with the following Two people to help with walking and/or transfers;Two people to help with bathing/dressing/bathroom;Help with stairs or ramp for entrance;Assist for transportation;Direct supervision/assist for medications management;Assistance with feeding;Assistance with cooking/housework   Equipment Recommendations  None recommended by PT    Recommendations for Other Services       Precautions / Restrictions  Precautions Precautions: Fall Restrictions Weight Bearing Restrictions: No     Mobility  Bed Mobility Overal bed mobility: Needs Assistance Bed Mobility: Supine to Sit, Sit to Supine     Supine to sit: Max assist, HOB elevated Sit to supine: +2 for safety/equipment, Total assist   General bed mobility comments: assistance for trunk and BLE support. verbal cues for technique and sequencing. increased time and effort required    Transfers Overall transfer level: Needs assistance Equipment used: 2 person hand held assist Transfers: Sit to/from Stand Sit to Stand: Max assist, +2 physical assistance           General transfer comment: 2 bouts of standing performed from bed. left knee blocked to prevent buckling and faciliation for hip extension and upright posture. patient continues to have ankles plantarflexed with limited flat foot/heel weight bearing.    Ambulation/Gait               General Gait Details: unable to due to poor standing tolerance and generalized weakness   Stairs             Wheelchair Mobility    Modified Rankin (Stroke Patients Only)       Balance Overall balance assessment: Needs assistance Sitting-balance support: Bilateral upper extremity supported Sitting balance-Leahy Scale: Poor Sitting balance - Comments: Min A- Mod A required to maintain sitting balance. faciliation and cues for midline, anterior weight shifting Postural control: Posterior lean Standing balance support: Bilateral upper extremity supported Standing balance-Leahy Scale: Zero Standing balance comment: total assistance +2 person to maintain standing balance with standing tolerance of less than 20 seconds                            Cognition Arousal/Alertness: Lethargic Behavior During Therapy: Flat affect Overall Cognitive Status: History of cognitive impairments -  at baseline                                 General Comments:  patient remains lethargic but is more alert today than previous sessions. she was able to states 3-4 word responses today, cooperative with PT session. son at the bedside. able to follow single step commands with extra time        Exercises General Exercises - Lower Extremity Ankle Circles/Pumps: AAROM, Strengthening, Both, 10 reps, Supine (unable to achieve neutral ankle position) Heel Slides: AAROM, Strengthening, Both, 10 reps, Supine Hip ABduction/ADduction: AAROM, Strengthening, Both, 10 reps, Supine Other Exercises Other Exercises: verbal cues for exercise technique    General Comments        Pertinent Vitals/Pain Pain Assessment Pain Assessment: No/denies pain    Home Living                          Prior Function            PT Goals (current goals can now be found in the care plan section) Acute Rehab PT Goals Patient Stated Goal: patient unable to participate with goal setting. son has the goal of getting to the appropriate rehab PT Goal Formulation: With family Time For Goal Achievement: 04/16/22 Potential to Achieve Goals: Fair Progress towards PT goals: Progressing toward goals    Frequency    7X/week      PT Plan Current plan remains appropriate    Co-evaluation              AM-PAC PT "6 Clicks" Mobility   Outcome Measure  Help needed turning from your back to your side while in a flat bed without using bedrails?: A Lot Help needed moving from lying on your back to sitting on the side of a flat bed without using bedrails?: Total Help needed moving to and from a bed to a chair (including a wheelchair)?: Total Help needed standing up from a chair using your arms (e.g., wheelchair or bedside chair)?: Total Help needed to walk in hospital room?: Total Help needed climbing 3-5 steps with a railing? : Total 6 Click Score: 7    End of Session   Activity Tolerance: Patient limited by fatigue Patient left: in bed;with call bell/phone  within reach;with bed alarm set;with family/visitor present Nurse Communication: Mobility status PT Visit Diagnosis: Unsteadiness on feet (R26.81);Muscle weakness (generalized) (M62.81);Other abnormalities of gait and mobility (R26.89)     Time: 1103-1594 PT Time Calculation (min) (ACUTE ONLY): 23 min  Charges:  $Therapeutic Exercise: 8-22 mins $Therapeutic Activity: 8-22 mins                     Minna Merritts, PT, MPT    Percell Locus 04/05/2022, 11:28 AM

## 2022-04-05 NOTE — Care Management Important Message (Signed)
Important Message  Patient Details  Name: [name] MRN: [id number] Date of Birth: 1941/04/01   Medicare Important Message Given:  Yes  Talked with the patients son, [name] and he is agreement with the discharge plan. I thanked hm for his time.   [name] 04/05/2022, 10:42 AM

## 2022-05-20 ENCOUNTER — Encounter: Payer: Self-pay | Admitting: Emergency Medicine

## 2022-05-20 ENCOUNTER — Emergency Department: Payer: Medicare Other

## 2022-05-20 ENCOUNTER — Emergency Department
Admission: EM | Admit: 2022-05-20 | Discharge: 2022-05-20 | Disposition: A | Discharge status: Home / Self Care | Payer: Medicare Other | Attending: Emergency Medicine | Admitting: Emergency Medicine

## 2022-05-20 ENCOUNTER — Other Ambulatory Visit: Payer: Self-pay

## 2022-05-20 DIAGNOSIS — W19XXXA Unspecified fall, initial encounter: Secondary | ICD-10-CM | POA: Diagnosis not present

## 2022-05-20 DIAGNOSIS — S0083XA Contusion of other part of head, initial encounter: Secondary | ICD-10-CM | POA: Insufficient documentation

## 2022-05-20 DIAGNOSIS — F0390 Unspecified dementia without behavioral disturbance: Secondary | ICD-10-CM | POA: Diagnosis not present

## 2022-05-20 DIAGNOSIS — Z7901 Long term (current) use of anticoagulants: Secondary | ICD-10-CM | POA: Diagnosis not present

## 2022-05-20 DIAGNOSIS — S0990XA Unspecified injury of head, initial encounter: Secondary | ICD-10-CM | POA: Diagnosis present

## 2022-05-20 DIAGNOSIS — E039 Hypothyroidism, unspecified: Secondary | ICD-10-CM | POA: Diagnosis not present

## 2022-05-20 LAB — BASIC METABOLIC PANEL
Anion gap: 7 (ref 5–15)
BUN: 13 mg/dL (ref 8–23)
CO2: 27 mmol/L (ref 22–32)
Calcium: 8.6 mg/dL — ABNORMAL LOW (ref 8.9–10.3)
Chloride: 106 mmol/L (ref 98–111)
Creatinine, Ser: 0.87 mg/dL (ref 0.44–1.00)
GFR, Estimated: >60 mL/min (ref >60)
Glucose, Bld: 96 mg/dL (ref 70–99)
Potassium: 3.6 mmol/L (ref 3.5–5.1)
Sodium: 140 mmol/L (ref 135–145)

## 2022-05-20 LAB — CBC
HCT: 37 % (ref 36.0–46.0)
Hemoglobin: 11.6 g/dL — ABNORMAL LOW (ref 12.0–15.0)
MCH: 29.2 pg (ref 26.0–34.0)
MCHC: 31.4 g/dL (ref 30.0–36.0)
MCV: 93.2 fL (ref 80.0–100.0)
Platelets: 247 10*3/uL (ref 150–400)
RBC: 3.97 MIL/uL (ref 3.87–5.11)
RDW: 12.9 % (ref 11.5–15.5)
WBC: 10.1 10*3/uL (ref 4.0–10.5)
nRBC: 0 % (ref 0.0–0.2)

## 2022-05-20 NOTE — ED Provider Notes (Signed)
[facility] Provider Note    Event Date/Time   First MD Initiated Contact with Patient 05/20/22 1117     (approximate)   History   Chief Complaint Fall   HPI [name] is a 81 y.o. female, history of bipolar 1 disorder, dementia, prior CVA, hypothyroidism, dermatomyositis, paroxysmal atrial fibrillation, presents to the emergency department for evaluation of injury sustained from fall.  She presents via EMS from [facility].  Staff said that she suffered from a unwitnessed fall and was found to have a bruise on the right side of her head above the right eye.  Patient states that she does not remember the fall.  She is currently denying any pain or symptoms.  She is joined by a relative, who states that the patient appears well and is behaving at her baseline.  She is currently on Eliquis.  She denies fever/chills, chest pain, shortness of breath, neck pain, abdominal pain, nausea/vomiting, diarrhea, dysuria, or dizziness/lightheadedness.  History Limitations: Patient has dementia.        Physical Exam  Triage Vital Signs: ED Triage Vitals  Enc Vitals Group     BP 05/20/22 1031 (!) 143/92     Pulse Rate 05/20/22 1031 98     Resp 05/20/22 1031 20     Temp 05/20/22 1031 98.2 F (36.8 C)     Temp Source 05/20/22 1031 Oral     SpO2 05/20/22 1031 98 %     Weight 05/20/22 1026 175 lb (79.4 kg)     Height 05/20/22 1026 '5\' 2"'$  (1.575 m)     Head Circumference --      Peak Flow --      Pain Score --      Pain Loc --      Pain Edu? --      Excl. in [place]? --     Most recent vital signs: Vitals:   05/20/22 1031  BP: (!) 143/92  Pulse: 98  Resp: 20  Temp: 98.2 F (36.8 C)  SpO2: 98%    General: Awake, NAD.  Appears happy.  Skin: Warm, dry. No rashes or lesions.  Eyes: PERRL. Conjunctivae normal.  Gross visual acuity intact.  EOMI. CV: Good peripheral perfusion.  S1-S2 present.  No murmurs, rubs, or gallops. Resp: Normal effort.  Lung  sounds are clear bilaterally. Abd: Soft, non-tender. No distention.  Neuro: At baseline. No gross neurological deficits.  She has baseline weakness in her left upper and left lower extremity secondary to prior CVA.  Focused Exam: Hematoma present above the right orbit.  No abrasions or lacerations noted on the scalp or face.  No bony tenderness along the maxillofacial region.  Normal range of motion of the head/neck.  Physical Exam    ED Results / Procedures / Treatments  Labs (all labs ordered are listed, but only abnormal results are displayed) Labs Reviewed  BASIC METABOLIC PANEL - Abnormal; Notable for the following components:      Result Value   Calcium 8.6 (*)    All other components within normal limits  CBC - Abnormal; Notable for the following components:   Hemoglobin 11.6 (*)    All other components within normal limits  URINALYSIS, ROUTINE W REFLEX MICROSCOPIC  PROTIME-INR  APTT  CBG MONITORING, ED     EKG Atrial fibrillation with right bundle branch block, consistent with previous EKG.  Rate of 98.  No ST segment changes.  No prolonged QT.   RADIOLOGY  ED Provider Interpretation: I personally viewed and interpreted the CT scans, no evidence of acute intracranial pathology on head CT.  Cervical spine CT shows no evidence of acute fracture.  CT Cervical Spine Wo Contrast  Result Date: 05/20/2022 CLINICAL DATA:  Trauma, fall EXAM: CT CERVICAL SPINE WITHOUT CONTRAST TECHNIQUE: Multidetector CT imaging of the cervical spine was performed without intravenous contrast. Multiplanar CT image reconstructions were also generated. RADIATION DOSE REDUCTION: This exam was performed according to the departmental dose-optimization program which includes automated exposure control, adjustment of the mA and/or kV according to patient size and/or use of iterative reconstruction technique. COMPARISON:  None Available. FINDINGS: Motion artifacts limit evaluation. Alignment: Alignment of  the posterior margins of the vertebral bodies is unremarkable. Skull base and vertebrae: No displaced fractures are seen. Soft tissues and spinal canal: There is no central spinal stenosis. Degenerative changes are noted with disc space narrowing, bony spurs and facet hypertrophy at multiple levels. Disc levels: There is encroachment of neural foramina from C3 to T1 levels. Upper chest: No focal abnormalities are seen in the upper lung fields. Other: None. IMPRESSION: No displaced fracture is seen. Significant motion artifacts limited evaluation for nondisplaced fractures. If there are continued symptoms, short-term follow-up CT or MRI should be considered. Cervical spondylosis with encroachment of neural foramina from C3-T1 levels. Electronically Signed   By: Elmer Picker M.D.   On: 05/20/2022 11:51   CT HEAD WO CONTRAST  Result Date: 05/20/2022 CLINICAL DATA:  Trauma, fall EXAM: CT HEAD WITHOUT CONTRAST TECHNIQUE: Contiguous axial images were obtained from the base of the skull through the vertex without intravenous contrast. RADIATION DOSE REDUCTION: This exam was performed according to the departmental dose-optimization program which includes automated exposure control, adjustment of the mA and/or kV according to patient size and/or use of iterative reconstruction technique. COMPARISON:  04/05/2022 FINDINGS: Brain: No acute intracranial findings are seen in noncontrast CT brain. There are no signs of bleeding. Cortical sulci are prominent. There is encephalomalacia in the left frontal lobe suggesting old infarct. There is decreased density in periventricular and subcortical white matter. Old lacunar infarct is seen in right basal ganglia. Vascular: Unremarkable. Skull: Unremarkable. Sinuses/Orbits: Unremarkable. Other: None. IMPRESSION: No acute intracranial findings are seen. Atrophy. Small vessel disease. There are foci of old infarcts with no significant interval change. Electronically Signed   By:  Elmer Picker M.D.   On: 05/20/2022 11:45    PROCEDURES:  Critical Care performed: N/A.  Procedures    MEDICATIONS ORDERED IN ED: Medications - No data to display   IMPRESSION / MDM / [place] / ED COURSE  I reviewed the triage vital signs and the nursing notes.                              Differential diagnosis includes, but is not limited to, epidural/subdural hematoma, concussion, cervical strain, cervical spine fracture,   ED Course Patient appears well, NAD.  Appears alert and attentive.  CBC shows no leukocytosis or significant anemia.  BMP shows no significant electrolyte abnormalities or AKI.  Assessment/Plan Patient presents with head injury secondary to unwitnessed fall, suspected to be from her bed.  Patient does not remember falling, however does not appear to be in any distress at all at this time.  Per staff and her relative, she is behaving at her baseline.  No gross neurological deficits new from her previous motor weakness on the left upper and  left lower extremities.  Lab work-up is unremarkable.  Her CT scans do not show any acute fractures.  Motion artifact limited evaluation of the cervical spine, though she does not have any cervical spine tenderness and maintains normal range of motion of her head and neck.  Very low suspicion for any occult pathology warranting further imaging.  She has a small hematoma on the right side of her head with no significant ocular injuries.  Advised her to ice and take Tylenol as needed.  Pending urinalysis, though she is not endorsing any fever, chills, or urinary symptoms and overall appears clinically well.  Very low suspicion for UTI.  She has a appointment with a healthcare provider tomorrow.  I believe she is safe for discharge back to [facility].  Patient's presentation is most consistent with acute complicated illness / injury requiring diagnostic workup.   Considered admission for this patient, but  given her clinical presentation, unremarkable work-up, and close follow-up, she is unlikely to benefit from admission.  Provided the patient with anticipatory guidance, return precautions, and educational material. Encouraged the patient to return to the emergency department at any time if they begin to experience any new or worsening symptoms. Patient expressed understanding and agreed with the plan.       FINAL CLINICAL IMPRESSION(S) / ED DIAGNOSES   Final diagnoses:  Fall, initial encounter     Rx / DC Orders   ED Discharge Orders     None        Note:  This document was prepared using Dragon voice recognition software and may include unintentional dictation errors.   Teodoro Spray, [place] 05/20/22 1218    Naaman Plummer, MD 05/20/22 Lurena Nida

## 2022-05-20 NOTE — ED Notes (Signed)
Family at bedside   Waiting for transport .

## 2022-05-20 NOTE — Discharge Instructions (Signed)
-  Ice the bruise on the right side of your head over the next 24 to 48 hours.  You may take Tylenol as needed for pain.  -Recommend sleeping in the middle of the bed to prevent rolling off at night.  -Return to the emergency department anytime if you begin to experience any new or worsening symptoms.

## 2022-05-20 NOTE — ED Triage Notes (Addendum)
First Nurse Note:  Pt via EMS from [facility]. Staff said she fell, unwitnessed. Pt is Eliquis, positive head injury, bruising to the R eye. Denies any pain at this time. Pt has hx of dementia but at her baseline. Pt states she does not remember falling this AM. Pt is A&Ox2 and NAD. Oriented to self and place. Disoriented to time and situation.   108/64 98% 59 HR

## 2022-09-27 ENCOUNTER — Ambulatory Visit: Payer: Medicare Other | Admitting: Podiatry

## 2024-07-03 ENCOUNTER — Emergency Department
Admission: EM | Admit: 2024-07-03 | Discharge: 2024-07-04 | Disposition: A | Discharge status: Skilled Nursing Facility | Payer: Medicare (Managed Care) | Attending: Emergency Medicine | Admitting: Emergency Medicine

## 2024-07-03 ENCOUNTER — Emergency Department: Payer: Medicare (Managed Care)

## 2024-07-03 DIAGNOSIS — Z8673 Personal history of transient ischemic attack (TIA), and cerebral infarction without residual deficits: Secondary | ICD-10-CM | POA: Diagnosis not present

## 2024-07-03 DIAGNOSIS — W06XXXA Fall from bed, initial encounter: Secondary | ICD-10-CM | POA: Insufficient documentation

## 2024-07-03 DIAGNOSIS — W19XXXA Unspecified fall, initial encounter: Secondary | ICD-10-CM

## 2024-07-03 DIAGNOSIS — S0083XA Contusion of other part of head, initial encounter: Secondary | ICD-10-CM | POA: Diagnosis not present

## 2024-07-03 DIAGNOSIS — F0390 Unspecified dementia without behavioral disturbance: Secondary | ICD-10-CM | POA: Diagnosis not present

## 2024-07-03 DIAGNOSIS — S0990XA Unspecified injury of head, initial encounter: Secondary | ICD-10-CM

## 2024-07-03 DIAGNOSIS — I4891 Unspecified atrial fibrillation: Secondary | ICD-10-CM | POA: Insufficient documentation

## 2024-07-03 DIAGNOSIS — T148XXA Other injury of unspecified body region, initial encounter: Secondary | ICD-10-CM

## 2024-07-03 DIAGNOSIS — Z7901 Long term (current) use of anticoagulants: Secondary | ICD-10-CM | POA: Insufficient documentation

## 2024-07-03 LAB — CBC WITH DIFFERENTIAL/PLATELET
Abs Immature Granulocytes: 0.04 K/uL (ref 0.00–0.07)
Basophils Absolute: 0 K/uL (ref 0.0–0.1)
Basophils Relative: 0 %
Eosinophils Absolute: 0.1 K/uL (ref 0.0–0.5)
Eosinophils Relative: 1 %
HCT: 38 % (ref 36.0–46.0)
Hemoglobin: 12.4 g/dL (ref 12.0–15.0)
Immature Granulocytes: 0 %
Lymphocytes Relative: 12 %
Lymphs Abs: 1.5 K/uL (ref 0.7–4.0)
MCH: 30.3 pg (ref 26.0–34.0)
MCHC: 32.6 g/dL (ref 30.0–36.0)
MCV: 92.9 fL (ref 80.0–100.0)
Monocytes Absolute: 0.5 K/uL (ref 0.1–1.0)
Monocytes Relative: 4 %
Neutro Abs: 10 K/uL — ABNORMAL HIGH (ref 1.7–7.7)
Neutrophils Relative %: 83 %
Platelets: 212 K/uL (ref 150–400)
RBC: 4.09 MIL/uL (ref 3.87–5.11)
RDW: 13.1 % (ref 11.5–15.5)
WBC: 12.1 K/uL — ABNORMAL HIGH (ref 4.0–10.5)
nRBC: 0 % (ref 0.0–0.2)

## 2024-07-03 LAB — BASIC METABOLIC PANEL WITH GFR
Anion gap: 10 (ref 5–15)
BUN: 23 mg/dL (ref 8–23)
CO2: 21 mmol/L — ABNORMAL LOW (ref 22–32)
Calcium: 8.7 mg/dL — ABNORMAL LOW (ref 8.9–10.3)
Chloride: 106 mmol/L (ref 98–111)
Creatinine, Ser: 0.57 mg/dL (ref 0.44–1.00)
GFR, Estimated: >60 mL/min (ref >60)
Glucose, Bld: 122 mg/dL — ABNORMAL HIGH (ref 70–99)
Potassium: 3.7 mmol/L (ref 3.5–5.1)
Sodium: 137 mmol/L (ref 135–145)

## 2024-07-03 LAB — PROTIME-INR
INR: 1.1 (ref 0.8–1.2)
Prothrombin Time: 14.6 s (ref 11.4–15.2)

## 2024-07-03 NOTE — Discharge Instructions (Signed)
 Please ice the forehead hematoma for 20 to 30 minutes every 2 hours to help reduce swelling.  Return to the emergency department for any significant enlargement of hematoma (we do expect that the discoloration/bruising will extend down the face as the hematoma resolves), or for any other symptom concerning to yourself or staff members.

## 2024-07-03 NOTE — ED Notes (Signed)
 Attempted to call [facility] with no answer.

## 2024-07-03 NOTE — ED Provider Notes (Signed)
 [facility] Provider Note    Event Date/Time   First MD Initiated Contact with Patient 07/03/24 1813     (approximate)  History   Chief Complaint: Fall  HPI  [name] is a 83 y.o. female with a past medical history of anxiety, bipolar, prior CVA, paroxysmal atrial fibrillation on Eliquis , dementia, presents to the emergency department for a fall.  According to report patient is coming from [facility] nursing facility where she had a fall out of bed.  Patient has a hematoma to the forehead.  No reported LOC.  Patient acting at baseline per son who is here with the patient.  Patient does have a large hematoma to the forehead is not complaining of any symptoms does not appear to be in any pain.  Physical Exam   Triage Vital Signs: ED Triage Vitals [07/03/24 1741]  Encounter Vitals Group     BP 135/63     Girls Systolic BP Percentile      Girls Diastolic BP Percentile      Boys Systolic BP Percentile      Boys Diastolic BP Percentile      Pulse Rate 68     Resp 16     Temp 98.5 F (36.9 C)     Temp Source Oral     SpO2 95 %     Weight      Height      Head Circumference      Peak Flow      Pain Score      Pain Loc      Pain Education      Exclude from Growth Chart     Most recent vital signs: Vitals:   07/03/24 1741  BP: 135/63  Pulse: 68  Resp: 16  Temp: 98.5 F (36.9 C)  SpO2: 95%    General: Awake, no distress.  Patient has a moderate-sized hematoma to the right forehead no bleeding, no skin tear or laceration. CV:  Good peripheral perfusion.  Resp:  Normal effort.  Equal breath sounds bilaterally.  Abd:  No distention.  Soft, nontender Other:  Good range of motion in all extremities.  No pain elicited.   ED Results / Procedures / Treatments   RADIOLOGY  I have reviewed and interpreted the CT head images.  Patient has a quite large frontal scalp hematoma.  But no intracranial abnormality.   MEDICATIONS ORDERED IN  ED: Medications - No data to display   IMPRESSION / MDM / ASSESSMENT AND PLAN / ED COURSE  I reviewed the triage vital signs and the nursing notes.  Patient's presentation is most consistent with acute presentation with potential threat to life or bodily function.  Patient presents to the emergency department after a fall at her nursing facility.  Patient is on Eliquis , history of dementia.  Patient does have a large hematoma to her right forehead.  No bleeding externally.  No laceration.  No apparent C-spine tenderness on exam good range of motion in all extremities.  CT scan does show large forehead hematoma but I do not appreciate any intracranial abnormality.  Radiology confirms no intracranial bleed.  Lab work is pending.  Will apply ice and a compressive dressing to the head and continue to closely monitor.  Son agreeable to plan.  Patient acting at baseline per son.  Patient has now been in the emergency department approximately 3 hours, it has been approximately 2 hours since I last evaluated the patient  the hematoma after being placed with an ice dressing has not expanded any further.  Discussed with the son to have her see her primary care doctor on Monday to reevaluate the hematoma.  Believe the patient will be safe for discharge home with close follow-up.  FINAL CLINICAL IMPRESSION(S) / ED DIAGNOSES   Fall Head injury Hematoma   Note:  This document was prepared using Dragon voice recognition software and may include unintentional dictation errors.   Dorothyann Drivers, MD 07/03/24 2024

## 2024-07-03 NOTE — ED Triage Notes (Signed)
 Arrives via [facility] from [facility]. C/OP falling out of bed one hour ago, hit head. Hematoma to forehead.  No lOC. Takes Eliquis . Alert to baseline.  ALso C?O Nausea  VS wnl

## 2024-11-29 DEATH — deceased
